# Patient Record
Sex: Female | Born: 1980
Health system: Southern US, Community
[De-identification: ages and names within clinical notes are randomized; demographics above are authoritative.]

## PROBLEM LIST (undated history)

## (undated) DIAGNOSIS — F429 Obsessive-compulsive disorder, unspecified: Secondary | ICD-10-CM

## (undated) DIAGNOSIS — E039 Hypothyroidism, unspecified: Secondary | ICD-10-CM

## (undated) HISTORY — DX: Obsessive-compulsive disorder, unspecified: F42.9

## (undated) HISTORY — DX: Hypothyroidism, unspecified: E03.9

## (undated) HISTORY — PX: WISDOM TOOTH EXTRACTION: SHX21

---

## 2015-12-13 DIAGNOSIS — D539 Nutritional anemia, unspecified: Secondary | ICD-10-CM | POA: Diagnosis not present

## 2015-12-13 DIAGNOSIS — R7989 Other specified abnormal findings of blood chemistry: Secondary | ICD-10-CM | POA: Diagnosis not present

## 2015-12-13 DIAGNOSIS — E559 Vitamin D deficiency, unspecified: Secondary | ICD-10-CM | POA: Diagnosis not present

## 2016-03-10 DIAGNOSIS — E559 Vitamin D deficiency, unspecified: Secondary | ICD-10-CM | POA: Diagnosis not present

## 2016-03-10 DIAGNOSIS — E039 Hypothyroidism, unspecified: Secondary | ICD-10-CM | POA: Diagnosis not present

## 2016-03-10 DIAGNOSIS — D539 Nutritional anemia, unspecified: Secondary | ICD-10-CM | POA: Diagnosis not present

## 2016-03-22 DIAGNOSIS — Z131 Encounter for screening for diabetes mellitus: Secondary | ICD-10-CM | POA: Diagnosis not present

## 2016-05-12 DIAGNOSIS — H5213 Myopia, bilateral: Secondary | ICD-10-CM | POA: Diagnosis not present

## 2016-05-12 DIAGNOSIS — H52223 Regular astigmatism, bilateral: Secondary | ICD-10-CM | POA: Diagnosis not present

## 2016-07-07 DIAGNOSIS — R7989 Other specified abnormal findings of blood chemistry: Secondary | ICD-10-CM | POA: Diagnosis not present

## 2016-07-07 DIAGNOSIS — Z131 Encounter for screening for diabetes mellitus: Secondary | ICD-10-CM | POA: Diagnosis not present

## 2016-07-07 DIAGNOSIS — E559 Vitamin D deficiency, unspecified: Secondary | ICD-10-CM | POA: Diagnosis not present

## 2016-07-07 DIAGNOSIS — E039 Hypothyroidism, unspecified: Secondary | ICD-10-CM | POA: Diagnosis not present

## 2016-07-31 DIAGNOSIS — L578 Other skin changes due to chronic exposure to nonionizing radiation: Secondary | ICD-10-CM | POA: Diagnosis not present

## 2016-07-31 DIAGNOSIS — D485 Neoplasm of uncertain behavior of skin: Secondary | ICD-10-CM | POA: Diagnosis not present

## 2016-07-31 DIAGNOSIS — D225 Melanocytic nevi of trunk: Secondary | ICD-10-CM | POA: Diagnosis not present

## 2017-05-18 DIAGNOSIS — H5213 Myopia, bilateral: Secondary | ICD-10-CM | POA: Diagnosis not present

## 2017-05-18 DIAGNOSIS — H52223 Regular astigmatism, bilateral: Secondary | ICD-10-CM | POA: Diagnosis not present

## 2017-08-15 DIAGNOSIS — E039 Hypothyroidism, unspecified: Secondary | ICD-10-CM | POA: Diagnosis not present

## 2017-08-15 DIAGNOSIS — Z79899 Other long term (current) drug therapy: Secondary | ICD-10-CM | POA: Diagnosis not present

## 2017-08-15 DIAGNOSIS — F429 Obsessive-compulsive disorder, unspecified: Secondary | ICD-10-CM | POA: Diagnosis not present

## 2017-08-15 DIAGNOSIS — E611 Iron deficiency: Secondary | ICD-10-CM | POA: Diagnosis not present

## 2017-08-15 DIAGNOSIS — E559 Vitamin D deficiency, unspecified: Secondary | ICD-10-CM | POA: Diagnosis not present

## 2017-08-15 DIAGNOSIS — Z13220 Encounter for screening for lipoid disorders: Secondary | ICD-10-CM | POA: Diagnosis not present

## 2017-08-15 DIAGNOSIS — Z131 Encounter for screening for diabetes mellitus: Secondary | ICD-10-CM | POA: Diagnosis not present

## 2017-11-23 DIAGNOSIS — M9905 Segmental and somatic dysfunction of pelvic region: Secondary | ICD-10-CM | POA: Diagnosis not present

## 2017-11-23 DIAGNOSIS — M545 Low back pain: Secondary | ICD-10-CM | POA: Diagnosis not present

## 2017-11-23 DIAGNOSIS — M9902 Segmental and somatic dysfunction of thoracic region: Secondary | ICD-10-CM | POA: Diagnosis not present

## 2017-11-23 DIAGNOSIS — M9903 Segmental and somatic dysfunction of lumbar region: Secondary | ICD-10-CM | POA: Diagnosis not present

## 2018-02-13 ENCOUNTER — Other Ambulatory Visit: Payer: Self-pay | Admitting: Family

## 2018-02-13 DIAGNOSIS — N63 Unspecified lump in unspecified breast: Secondary | ICD-10-CM

## 2018-02-13 DIAGNOSIS — Z0000 Encounter for general adult medical examination without abnormal findings: Secondary | ICD-10-CM | POA: Diagnosis not present

## 2018-02-13 DIAGNOSIS — Z1331 Encounter for screening for depression: Secondary | ICD-10-CM | POA: Diagnosis not present

## 2018-02-13 DIAGNOSIS — E559 Vitamin D deficiency, unspecified: Secondary | ICD-10-CM | POA: Diagnosis not present

## 2018-02-13 DIAGNOSIS — Z124 Encounter for screening for malignant neoplasm of cervix: Secondary | ICD-10-CM | POA: Diagnosis not present

## 2018-02-15 ENCOUNTER — Other Ambulatory Visit: Payer: Self-pay | Admitting: Family

## 2018-02-15 ENCOUNTER — Ambulatory Visit
Admission: RE | Admit: 2018-02-15 | Discharge: 2018-02-15 | Disposition: A | Discharge status: Home / Self Care | Payer: 59 | Source: Ambulatory Visit | Attending: Family | Admitting: Family

## 2018-02-15 DIAGNOSIS — N63 Unspecified lump in unspecified breast: Secondary | ICD-10-CM

## 2018-02-15 DIAGNOSIS — N6489 Other specified disorders of breast: Secondary | ICD-10-CM | POA: Diagnosis not present

## 2018-02-15 DIAGNOSIS — R922 Inconclusive mammogram: Secondary | ICD-10-CM | POA: Diagnosis not present

## 2018-08-09 DIAGNOSIS — H5213 Myopia, bilateral: Secondary | ICD-10-CM | POA: Diagnosis not present

## 2018-08-09 DIAGNOSIS — H52223 Regular astigmatism, bilateral: Secondary | ICD-10-CM | POA: Diagnosis not present

## 2018-09-05 DIAGNOSIS — Z862 Personal history of diseases of the blood and blood-forming organs and certain disorders involving the immune mechanism: Secondary | ICD-10-CM | POA: Diagnosis not present

## 2018-09-05 DIAGNOSIS — E039 Hypothyroidism, unspecified: Secondary | ICD-10-CM | POA: Diagnosis not present

## 2018-09-05 DIAGNOSIS — E559 Vitamin D deficiency, unspecified: Secondary | ICD-10-CM | POA: Diagnosis not present

## 2018-09-05 DIAGNOSIS — F429 Obsessive-compulsive disorder, unspecified: Secondary | ICD-10-CM | POA: Diagnosis not present

## 2018-09-05 DIAGNOSIS — Z131 Encounter for screening for diabetes mellitus: Secondary | ICD-10-CM | POA: Diagnosis not present

## 2018-09-05 DIAGNOSIS — Z79899 Other long term (current) drug therapy: Secondary | ICD-10-CM | POA: Diagnosis not present

## 2018-09-05 DIAGNOSIS — Z6824 Body mass index (BMI) 24.0-24.9, adult: Secondary | ICD-10-CM | POA: Diagnosis not present

## 2018-12-11 DIAGNOSIS — E559 Vitamin D deficiency, unspecified: Secondary | ICD-10-CM | POA: Diagnosis not present

## 2019-03-12 DIAGNOSIS — Z6823 Body mass index (BMI) 23.0-23.9, adult: Secondary | ICD-10-CM | POA: Diagnosis not present

## 2019-03-12 DIAGNOSIS — Z131 Encounter for screening for diabetes mellitus: Secondary | ICD-10-CM | POA: Diagnosis not present

## 2019-03-12 DIAGNOSIS — E039 Hypothyroidism, unspecified: Secondary | ICD-10-CM | POA: Diagnosis not present

## 2019-03-12 DIAGNOSIS — F429 Obsessive-compulsive disorder, unspecified: Secondary | ICD-10-CM | POA: Diagnosis not present

## 2019-03-12 DIAGNOSIS — R5383 Other fatigue: Secondary | ICD-10-CM | POA: Diagnosis not present

## 2019-03-12 DIAGNOSIS — Z862 Personal history of diseases of the blood and blood-forming organs and certain disorders involving the immune mechanism: Secondary | ICD-10-CM | POA: Diagnosis not present

## 2019-03-12 DIAGNOSIS — E559 Vitamin D deficiency, unspecified: Secondary | ICD-10-CM | POA: Diagnosis not present

## 2019-09-05 DIAGNOSIS — H52223 Regular astigmatism, bilateral: Secondary | ICD-10-CM | POA: Diagnosis not present

## 2019-09-05 DIAGNOSIS — H5213 Myopia, bilateral: Secondary | ICD-10-CM | POA: Diagnosis not present

## 2019-10-27 DIAGNOSIS — Z1231 Encounter for screening mammogram for malignant neoplasm of breast: Secondary | ICD-10-CM | POA: Diagnosis not present

## 2019-10-27 DIAGNOSIS — E559 Vitamin D deficiency, unspecified: Secondary | ICD-10-CM | POA: Diagnosis not present

## 2019-10-27 DIAGNOSIS — Z862 Personal history of diseases of the blood and blood-forming organs and certain disorders involving the immune mechanism: Secondary | ICD-10-CM | POA: Diagnosis not present

## 2019-10-27 DIAGNOSIS — Z131 Encounter for screening for diabetes mellitus: Secondary | ICD-10-CM | POA: Diagnosis not present

## 2019-10-27 DIAGNOSIS — Z13220 Encounter for screening for lipoid disorders: Secondary | ICD-10-CM | POA: Diagnosis not present

## 2019-10-27 DIAGNOSIS — Z1331 Encounter for screening for depression: Secondary | ICD-10-CM | POA: Diagnosis not present

## 2019-10-27 DIAGNOSIS — E039 Hypothyroidism, unspecified: Secondary | ICD-10-CM | POA: Diagnosis not present

## 2019-10-27 DIAGNOSIS — Z6824 Body mass index (BMI) 24.0-24.9, adult: Secondary | ICD-10-CM | POA: Diagnosis not present

## 2019-10-27 DIAGNOSIS — Z0000 Encounter for general adult medical examination without abnormal findings: Secondary | ICD-10-CM | POA: Diagnosis not present

## 2020-03-16 ENCOUNTER — Other Ambulatory Visit (HOSPITAL_COMMUNITY): Payer: Self-pay | Admitting: Family

## 2020-05-24 ENCOUNTER — Ambulatory Visit (INDEPENDENT_AMBULATORY_CARE_PROVIDER_SITE_OTHER): Payer: 59

## 2020-05-24 ENCOUNTER — Other Ambulatory Visit: Payer: Self-pay

## 2020-05-24 DIAGNOSIS — Z23 Encounter for immunization: Secondary | ICD-10-CM

## 2020-05-24 NOTE — Progress Notes (Signed)
   Covid-19 Vaccination Clinic  Name:  Sarah Estrada    MRN: [id number] DOB: 26-Apr-1981  05/24/2020  Ms. Sarah Estrada was observed post Covid-19 immunization for 15 minutes without incident. She was provided with Vaccine Information Sheet and instruction to access the V-Safe system.   Ms. Sarah Estrada was instructed to call 911 with any severe reactions post vaccine: Marland Kitchen Difficulty breathing  . Swelling of face and throat  . A fast heartbeat  . A bad rash all over body  . Dizziness and weakness

## 2020-06-10 DIAGNOSIS — Z131 Encounter for screening for diabetes mellitus: Secondary | ICD-10-CM | POA: Diagnosis not present

## 2020-06-10 DIAGNOSIS — Z79899 Other long term (current) drug therapy: Secondary | ICD-10-CM | POA: Diagnosis not present

## 2020-06-10 DIAGNOSIS — E039 Hypothyroidism, unspecified: Secondary | ICD-10-CM | POA: Diagnosis not present

## 2020-06-10 DIAGNOSIS — Z13220 Encounter for screening for lipoid disorders: Secondary | ICD-10-CM | POA: Diagnosis not present

## 2020-06-10 DIAGNOSIS — E559 Vitamin D deficiency, unspecified: Secondary | ICD-10-CM | POA: Diagnosis not present

## 2020-06-10 DIAGNOSIS — F429 Obsessive-compulsive disorder, unspecified: Secondary | ICD-10-CM | POA: Diagnosis not present

## 2020-08-19 DIAGNOSIS — N926 Irregular menstruation, unspecified: Secondary | ICD-10-CM | POA: Diagnosis not present

## 2020-08-28 NOTE — L&D Delivery Note (Addendum)
OB/GYN Faculty Practice Delivery Note  Sarah Estrada is a 40 y.o. G1P0 s/p SVD at [redacted]w[redacted]d She was admitted for IOL for fetal cardiac anomalies.   ROM: 7h 34mith clear fluid GBS Status: Negative Maximum Maternal Temperature: 98.1  Labor Progress: Admitted for induction with 2 cm dilated cervix, given cytotec x 2, foley bulb, pitocin  NICU called to be in attendance of birth d/t suspected fetal cardiac abnormalities Delivery Date/Time: 04/06/2021 @ 0734 Delivery: Called to room and patient was complete and pushing. Patient with tight introitus and despite excellent maternal effort, head was not progressing and fetal heart rate dropped to the 60s without good recovery so a right midline episiotomy was made. Head delivered Left OA. No nuchal cord present. Shoulder and body delivered in usual fashion. Infant with spontaneous cry, placed on mother's abdomen, dried and stimulated. Cord clamped x 2 after 1-minute delay, and cut by MD (parents declined). Cord blood drawn. Pitocin infusing per protocol. Placenta delivered spontaneously with gentle cord traction.  Labia, perineum, vagina, and cervix inspected inspected with Rt MLE, Rt sulcal. Heavy bleeding noted immediately after delivery of placenta, TXA given. (All above by Sarah MerlMD under my direct supervision). At this point, I stepped in, fundus firm w/ massage, but still bleeding. '1000mg'$  cytotec ('600mg'$  rectal/'400mg'$  buccal) as I repaired lac/episiotomy, continued to have heavy bleeding, so methergine given and Dr. ArRoselie Awkwardalled in. I inserted a indwelling foley. LUS cleared of clots, small fragments of placenta/membranes removed, fundus firms w/ massage, but then bleeding returns. Pt beginning to feel symptomatic, hypotensive/normal HR, O2 via face mask administered, 2nd IV started, PPH labs obtained. Bakri balloon placed by Sarah Estrada difficulty and 2nd dose of methergine given. Bleeding slowed and stabilized. [facility] 1uFFP  per Dr. ArRoselie AwkwardLabs pending Placenta: to L&D Complications: Postpartum hemorrhage Lacerations: Right midline episiotomy/2nd degree laceration and right sulcal- repaired w/ 3.0vicryl EBL: 2300cc Analgesia: Epidural  Baby assessed by Sarah Estrada doing well, ok to remain in room w/ parents, plan echocardiogram during hospitalization.  Infant: Viable female infant  APGARs pending  pending   Sarah Estrada 04/06/2021, 7:44 AM  Sarah Estrada, WHNP-BC 04/06/2021 9:20 AM

## 2020-09-08 ENCOUNTER — Telehealth: Payer: Self-pay | Admitting: Family Medicine

## 2020-09-08 NOTE — Telephone Encounter (Signed)
Attempted to reach patient about scheduling an appointment.

## 2020-10-15 ENCOUNTER — Other Ambulatory Visit: Payer: Self-pay

## 2020-10-15 ENCOUNTER — Other Ambulatory Visit (HOSPITAL_COMMUNITY)
Admission: RE | Admit: 2020-10-15 | Discharge: 2020-10-15 | Disposition: A | Discharge status: Home / Self Care | Payer: 59 | Source: Ambulatory Visit | Attending: Obstetrics & Gynecology | Admitting: Obstetrics & Gynecology

## 2020-10-15 ENCOUNTER — Encounter: Payer: Self-pay | Admitting: Obstetrics & Gynecology

## 2020-10-15 ENCOUNTER — Other Ambulatory Visit (HOSPITAL_COMMUNITY): Payer: Self-pay | Admitting: Obstetrics & Gynecology

## 2020-10-15 ENCOUNTER — Ambulatory Visit (INDEPENDENT_AMBULATORY_CARE_PROVIDER_SITE_OTHER): Payer: 59 | Admitting: Obstetrics & Gynecology

## 2020-10-15 VITALS — BP 101/60 | HR 73 | Ht 66.0 in | Wt 163.2 lb

## 2020-10-15 DIAGNOSIS — O99280 Endocrine, nutritional and metabolic diseases complicating pregnancy, unspecified trimester: Secondary | ICD-10-CM | POA: Insufficient documentation

## 2020-10-15 DIAGNOSIS — E039 Hypothyroidism, unspecified: Secondary | ICD-10-CM | POA: Insufficient documentation

## 2020-10-15 DIAGNOSIS — F429 Obsessive-compulsive disorder, unspecified: Secondary | ICD-10-CM

## 2020-10-15 DIAGNOSIS — O0992 Supervision of high risk pregnancy, unspecified, second trimester: Secondary | ICD-10-CM | POA: Insufficient documentation

## 2020-10-15 DIAGNOSIS — O0990 Supervision of high risk pregnancy, unspecified, unspecified trimester: Secondary | ICD-10-CM | POA: Insufficient documentation

## 2020-10-15 DIAGNOSIS — O99281 Endocrine, nutritional and metabolic diseases complicating pregnancy, first trimester: Secondary | ICD-10-CM | POA: Diagnosis not present

## 2020-10-15 DIAGNOSIS — Z3A13 13 weeks gestation of pregnancy: Secondary | ICD-10-CM

## 2020-10-15 DIAGNOSIS — O09519 Supervision of elderly primigravida, unspecified trimester: Secondary | ICD-10-CM | POA: Insufficient documentation

## 2020-10-15 HISTORY — DX: Supervision of elderly primigravida, unspecified trimester: O09.519

## 2020-10-15 MED ORDER — ASPIRIN EC 81 MG PO TBEC: 81.0000 mg | DELAYED_RELEASE_TABLET | Freq: Every day | ORAL | 2 refills | Status: DC

## 2020-10-15 NOTE — Progress Notes (Signed)
History:   Sarah Estrada is a 40 y.o. G1P0 at [redacted]w[redacted]d by LMP being seen today for her first obstetrical visit.  Her obstetrical history is significant for advanced maternal age and hypothyroidism. Patient does intend to breast feed. Pregnancy history fully reviewed.  Patient reports no complaints. Here with husband. 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      HISTORY: OB History  Gravida Para Term Preterm AB Living  1 0 0 0 0 0  SAB IAB Ectopic Multiple Live Births  0 0 0 0 0    # Outcome Date GA Lbr Len/2nd Weight Sex Delivery Anes PTL Lv  1 Current           Last pap smear was done 2019 and was normal  Past Medical History:  Diagnosis Date  . Hypothyroidism   . OCD (obsessive compulsive disorder)    Past Surgical History:  Procedure Laterality Date  . WISDOM TOOTH EXTRACTION     Family History  Problem Relation Age of Onset  . Breast cancer Neg Hx    Social History   Tobacco Use  . Smoking status: Never Smoker  . Smokeless tobacco: Never Used  Substance Use Topics  . Alcohol use: Not Currently  . Drug use: Never   Not on File Current Outpatient Medications on File Prior to Visit  Medication Sig Dispense Refill  . buPROPion (ZYBAN) 150 MG 12 hr tablet Take 150 mg by mouth 2 (two) times daily.    Marland Kitchen escitalopram (LEXAPRO) 20 MG tablet Take 20 mg by mouth daily.    Marland Kitchen levothyroxine (SYNTHROID) 75 MCG tablet Take 75 mcg by mouth daily before breakfast.    . omega-3 acid ethyl esters (LOVAZA) 1 g capsule Take by mouth 2 (two) times daily.    . Prenatal Vit-Fe Fumarate-FA (PRENATAL MULTIVITAMIN) TABS tablet Take 1 tablet by mouth daily at 12 noon.     No current facility-administered medications on file prior to visit.    Review of Systems Pertinent items noted in HPI and remainder of comprehensive ROS otherwise negative.  Physical Exam:   Vitals:   10/15/20 1031 10/15/20 1037  BP: 101/60   Pulse: 73   Weight: 163 lb 3.2 oz (74 kg)   Height:   5\' 6"  (1.676 m)   Bedside Ultrasound for FHR check: Viable intrauterine pregnancy with positive cardiac activity noted. Fetal Heart Rate (bpm): 157  Patient informed that the ultrasound is considered a limited obstetric ultrasound and is not intended to be a complete ultrasound exam.  Patient also informed that the ultrasound is not being completed with the intent of assessing for fetal or placental anomalies or any pelvic abnormalities.  Explained that the purpose of today's ultrasound is to assess for fetal heart rate.  Patient acknowledges the purpose of the exam and the limitations of the study. General: well-developed, well-nourished female in no acute distress  Breasts:  normal appearance, no masses or tenderness bilaterally  Skin: normal coloration and turgor, no rashes  Neurologic: oriented, normal, negative, normal mood  Extremities: normal strength, tone, and muscle mass, ROM of all joints is normal  HEENT PERRLA, extraocular movement intact and sclera clear, anicteric  Neck supple and no masses  Cardiovascular: regular rate and rhythm  Respiratory:  no respiratory distress, normal breath sounds  Abdomen: soft, non-tender; bowel sounds normal; no masses,  no organomegaly  Pelvic: normal external genitalia, no lesions, normal vaginal mucosa, normal vaginal discharge, normal cervix, pap smear  done.    Assessment:    Pregnancy: G1P0 Patient Active Problem List   Diagnosis Date Noted  . Supervision of high-risk pregnancy 10/15/2020  . Hypothyroidism affecting pregnancy in first trimester 10/15/2020  . Obsessive-compulsive disorder 10/15/2020     Plan:    1. Obsessive-compulsive disorder, unspecified type On Lexapro and Zyban, discussed risks of PPHN especially in third trimester.  Will discuss with mental health provider about alternatives.   2. Hypothyroidism affecting pregnancy in first trimester Followed by PCP. Continue Synthroid.  - TSH  3. [redacted] weeks gestation of  pregnancy 4. Supervision of high risk pregnancy in second trimester - Cytology - PAP( [place]) - CBC/D/Plt+RPR+Rh+ABO+Rub Ab... - CHL AMB BABYSCRIPTS SCHEDULE OPTIMIZATION - Culture, OB Urine - Genetic Screening - Hemoglobin A1c - buPROPion (ZYBAN) 150 MG 12 hr tablet; Take 150 mg by mouth 2 (two) times daily. - levothyroxine (SYNTHROID) 75 MCG tablet; Take 75 mcg by mouth daily before breakfast. - escitalopram (LEXAPRO) 20 MG tablet; Take 20 mg by mouth daily. - omega-3 acid ethyl esters (LOVAZA) 1 g capsule; Take by mouth 2 (two) times daily. - Prenatal Vit-Fe Fumarate-FA (PRENATAL MULTIVITAMIN) TABS tablet; Take 1 tablet by mouth daily at 12 noon. - [place] MFM OB DETAIL +14 WK; Future - Comprehensive metabolic panel - Protein / creatinine ratio, urine - aspirin EC 81 MG tablet; Take 1 tablet (81 mg total) by mouth daily. Take after 12 weeks for prevention of preeclampsia later in pregnancy  Dispense: 300 tablet; Refill: 2 - Enroll patient in Babyscripts Program Initial labs drawn. Continue prenatal vitamins. Problem list reviewed and updated. Genetic Screening discussed, NIPS: ordered. Ultrasound discussed; fetal anatomic survey: ordered. Anticipatory guidance about prenatal visits given including labs, ultrasounds, and testing. Discussed usage of Babyscripts and virtual visits as additional source of managing and completing prenatal visits in midst of coronavirus and pandemic.   Encouraged to complete MyChart Registration for her ability to review results, send requests, and have questions addressed.  The nature of [place] [facility] Healthcare/Faculty Practice with multiple MDs and Advanced Practice Providers was explained to patient; also emphasized that residents, students are part of our team. Routine obstetric precautions reviewed. Encouraged to seek out care at office or emergency room [facility] preferred) for urgent and/or emergent concerns. Return in about 4 weeks  (around 11/12/2020) for OFFICE OB VISIT (MD only).     [name], MD, [place] for [employer], [place]

## 2020-10-15 NOTE — Patient Instructions (Signed)
AREA PEDIATRIC/FAMILY PRACTICE PHYSICIANS  [place] ([address]) . [facility] o Chambliss, MD; Eniola, MD; Hale, MD; Hensel, MD; McDiarmid, MD; McIntyer, MD; Eriko Economos, MD; Walden, MD o [address] [place], [place] [address] o [phone] o Mon-Fri 8:30-12:30, 1:30-5:00 o Providers come to see babies at [facility] o Accepting Medicaid . [facility] o Limited providers who accept newborns: Koirala, MD; Morrow, MD; Wolters, MD o [address], [place], [place] [address] o [phone] o Mon-Fri 8:00-5:30 o Babies seen by providers at [facility] o Does NOT accept Medicaid o Please call early in hospitalization for appointment (limited availability)  . [facility] o Mulberry, MD o [address] [place], [place] [address] o [phone] o Mon, Tue, Thur, Fri 8:30-5:00, Wed 10:00-7:00 (closed 1-2pm) o Babies seen by [facility] providers o Accepting Medicaid . [facility] o Rubin, MD o [address], [place], [place] [address] o [phone] o Mon-Fri 8:30-5:00, Sat 8:30-12:00 o Provider comes to see babies at [facility] o Accepting Medicaid o Must have been referred from current patients or contacted office prior to delivery . [facility] ([facility]) o Brown, MD; Chandler, MD; Ettefagh, MD; Grant, MD; Lester, MD; McCormick, MD; McQueen, MD; Prose, MD; Simha, MD; Stanley, MD; Stryffeler, NP; Tebben, NP o [address], [place], [place] [address] o [phone] o Mon, Tue, Thur, Fri 8:30-5:30, Wed 9:30-5:30, Sat 8:30-12:30 o Babies seen by [facility] providers o Accepting Medicaid o Only accepting infants of first-time parents or siblings of current patients o Hospital discharge coordinator will make follow-up appointment . Sarah Estrada o [address],  [place], [place]  [address] o [phone]   Fax - [phone] . [facility] o [address], [place], [place]  [address] o Phone - [phone]   Fax - [phone] . Shilpa Gosrani o [address], [place], [place]  [address] o [phone]  [place] ([address]) . [facility] o Bates, MD; [place], MD; Cooper, Cox, MD; MD; Davis, MD; Dovico, MD; Ettefaugh, MD; Little, MD; Lowe, MD; Keiffer, MD; Melvin, MD; Sumner, MD; Williams, MD o [address], [place], [place] [address] o [phone] o Mon-Fri 8:30-5:00 (extended evenings Mon-Thur as needed), Sat-Sun 10:00-1:00 o Providers come to see babies at [facility] o Accepting Medicaid for families of first-time babies and families with all children in the household age 3 and under. Must register with office prior to making appointment (M-F only). . [facility] o Henson, NP; Knapp, MD; Lalonde, MD; Tysinger, PA o [address] [place], [place] [address] o [phone] o Mon-Fri 8:00-5:00 o Babies seen by providers at [facility] o Does NOT accept Medicaid/Commercial Insurance Only . [facility] ([facility])  o Artis, MD; Barnes, MD; Bratton, MD; Coccaro, MD; Lockett Gardner, MD; Kramer, MD; Marshall, MD; Netherton, MD; Poleto, MD; Skinner, MD o [address] [place], [place] [address] o [phone] o Mon-Fri 8:30-5:30, Sat (Oct.-Mar.) 9:00-1:00 o Babies seen by providers at [facility] o Accepting Medicaid  [place] ([address]) . [facility]  o Reid, MD; Warner, MD o [address], , [place] [address] o [phone] o Mon-Fri 8:30-5:00, Sat 8:30-12:00 o Providers come to see babies at [facility] o Does NOT accept Medicaid . [facility] o Becker, PA; Hagler, MD; Scifres, PA; Sun, MD; Swayne, MD o [address],  [place], [place] [address] o [phone] o Mon-Fri 8:00-5:00 o Babies seen by providers at [facility] o Does NOT accept Medicaid o Only accepting babies of parents who are patients o Please call early in hospitalization for appointment (limited availability) . [facility] o Clark, MD; Frye, MD; Kelleher, MD; Mack, NP; Miller, MD; O'Keller, MD; Patterson, NP; Pudlo, MD; Puzio, MD; Thomas, MD; Tucker, MD; Twiselton, MD o [address], [place], [place] [address] o [phone] o Mon-Fri 8:00-5:00, Sat 9:00-12:00 o Providers come to see babies at [facility] o Does NOT accept Medicaid  [place] ([address]) . [facility] o Limited providers accepting new patients: Brake, NP; Wharton, PA o [address], [place], [place] [address] o [phone] o Mon-Fri 8:00-5:00 o Babies seen by providers at [facility] o Does NOT accept Medicaid o Only accepting babies of parents who are patients o Please call early in hospitalization for appointment (limited availability) . [facility] o Gay, MD; Quinlan, MD o [address] [place], [place] [address] o [phone] (press 1 to schedule appointment) o Mon-Fri 8:00-5:00 o Providers come to see babies at [facility] o Does NOT accept Medicaid . [facility] o Mazer, MD o [address] [place], [place] [address] o [phone] o Mon-Fri 8:30-5:00 (lunch 12:30-1:00), extended hours by appointment only Wed 5:00-6:30 o Babies seen by [facility] providers o Accepting Medicaid . [facility] o Banks, MD; Jordan, MD; Koberlein, MD o [address], [place], [place] [address] o [phone] o Mon-Fri 8:00-5:00 o Babies seen by [facility] providers o Does NOT accept Medicaid .  [facility] o Parker, MD; Hunter, MD; Wallace, DO o [address] [place], [place]  [address] o [phone] o Mon-Fri 8:00-5:00 o Babies seen by [facility] providers o Does NOT accept Medicaid . [facility] o Brandon, PA; Brecken, PA; Christy, NP; Dees, MD; DeClaire, MD; DeWeese, MD; Hansen, NP; Mills, NP; Parrish, NP; Smoot, NP; Summer, MD; Vapne, MD o [address] [place], [place] [address] o [phone] o Mon-Fri 8:30-5:00, Sat 10:00-1:00 o Providers come to see babies at [facility] o Does NOT accept Medicaid o Free prenatal information session Tuesdays at 4:45pm . [facility] o Bouska, MD; Gordon, PA; Jeffery, PA; Weber, PA o [address] [place] [address] o [phone] o Mon-Fri 7:30-5:30 o Babies seen by [facility] providers . [facility] o [address], [place], [place]  [address] o [phone]   Fax - [phone]  [place] ([address] & [address]) . [facility] o Reese, MD o [address] [place], [place] [address] o [phone] o Mon-Thur 8:00-6:00 o Providers come to see babies at [facility] o Accepting Medicaid . [facility] o Anderson, NP; Badger, MD; Beal, PA; Spencer, PA o [address] [place],  [address] o [phone] o Mon-Thur 7:30-7:30, Fri 7:30-4:30 o Babies seen by [facility] providers o Accepting Medicaid . [facility] o Agbuya, MD; Klett, NP; Romgoolam, MD o [address], [place],  [address] o [phone] o Mon-Fri 8:30-5:00, Sat 8:30-12:00 o Providers come to see babies at [facility] o Accepting Medicaid o Must have "Meet & Greet" appointment at office prior to delivery . [facility] o McCord,   MD; Wallace, MD; Wood, MD o [address], [place], [place] [address] o [phone] o Mon-Wed 8:00-6:00, Thur-Fri 8:00-5:00, Sat 9:00-12:00 o Providers come to  see babies at [facility] o Does NOT accept Medicaid o Only accepting siblings of current patients . [facility]  o [address], [place], [place]  [address] o [phone]   Fax - [phone] . [facility] o [address], [place], [place]  [address] o [phone]   Fax - [phone]  [place] ([address] & [address]) . [facility] o Cirigliano, DO; Matthews, DO o [address] [place], [place] [address] o [phone] o Mon-Fri 7:00-5:00 o Babies seen by [facility] providers o Does NOT accept Medicaid . [facility] o Briscoe, MD; Howley, PA; Moreira, PA o [address], [place], [place] [address] o [phone] o Mon-Fri 8:00-5:00 o Babies seen by [facility] providers o Accepting Medicaid . [facility] o Boyd, MD; Church, PA; Jones, NP; Osborn, PA o [address], [place], [place] [address] o [phone] o Mon-Fri 8:00-5:00 o Babies seen by providers at [facility] o Accepting Medicaid  [place] ([address]) . [facility] [place] o Wendling, DO o [address] [place], [place] [address] o [phone] o Mon-Fri 8:00-5:00 o Babies seen by [facility] providers o Does NOT accept Medicaid o Limited availability, please call early in hospitalization to schedule follow-up . [facility] o Calderon, PA; Cummings, MD; Dillard, MD; Martin, PA; Olson, MD; VanDeven, PA o [address], [place], [place] [address] o [phone] o Mon-Fri 8:30-5:00, Sat 9:00-12:00 o Babies seen by providers at [facility] o Accepting Medicaid o Please register online then schedule online or call office o [email or web address] . [facility] o Hunter, NP; Kumar, MD; Martin Rogers, PA o [address], [place], [place] [address] o [phone] o Mon-Fri 8:00-5:00 o Babies seen by providers at [facility] o Accepting Medicaid . [facility] ([facility] o Ogle, MD; Kristi Fleenor, NP; West, MD o [address], [place], [place] [address] o [phone] o Mon-Fri 8:00-5:30, Sat&Sun by appointment (phones open at 8:30) o Babies seen by [facility] providers o Accepting Medicaid o Must be a first-time baby or sibling of current patient . [facility] [place]  o [address], [place], [place]  [address] o [phone]   Fax - [phone]  [place] ([address]) . [facility] o Brown, PA; Cowen, PA; Rice, MD; Helton, PA; Spry, MD o [address] [place], [place] [address] o [phone] o Mon-Thur 8:00-7:00, Fri 8:00-5:00, Sat 8:00-12:00, Sun 9:00-12:00 o Babies seen by [facility] providers o Accepting Medicaid . [facility] o Coe-Goins, MD; Marshall, MD; Pierre-Louis, MD o [address], [place], [place] [address] o [phone] o Mon-Thur 8:00-5:00 o Babies seen by providers at [facility] o Accepting Medicaid . [facility] o Bratton, MD; Coe-Goins, MD; Hayes, MD; Lewis, MD; List, MD; Lott, MD; Marshall, MD; Moran, MD; O'Raea Magallon, MD; Pierre-Louis, MD; Pitonzo, MD; Scholer, MD; Spangle, MD o [address] [place], [place]   [address] o [phone] o Mon-Fri 8:00-5:30, Sat (Oct.-Mar.) 9:00-1:00 o Babies seen by providers at [facility] o Accepting Medicaid o Must fill out new patient packet, available online at [email or web address] . [facility] ([facility] o Friddle, NP; Harris, NP; Kelly, NP; Logan, MD;  Melvin, PA; Poth, MD; Ramadoss, MD; [place], NP o [address], [place], [place] [address] o [phone] o Mon-Thur 8:00-5:30, Fri 8:00-5:00 o Babies seen by providers at [facility] o Accepting Medicaid  [place] ([address]) . [facility] o Dixon, PA; Wailuku, MD; Pickard, MD; Tapia, PA o [address], [place], [place] [address] o [phone] o Mon-Fri 8:00-5:00 o Babies seen by providers at [facility] o Accepting Medicaid   [place] ([address]) . [facility] [place] o Masneri, DO; Meyers, MD; Nelson, PA o [address], [place], [place] [address] o [phone] o Mon-Fri 8:00-5:00 o Babies seen by providers at [facility] o Does NOT accept Medicaid o Limited appointment availability, please call early in hospitalization  . [facility] [place] o Kunedd, DO; McGowen, MD o [address], [place], [place] [address] o [phone] o Mon-Fri 8:00-5:00 o Babies seen by [facility] providers o Does NOT accept Medicaid . [facility] [place] o Cameron, MD; MacDonald, MD; Michaels, PA; Nayak, MD o [address] [place] [address], [place], [place] [address] o [phone] o Mon-Fri 8:00-5:00 o After hours clinic ([address] [place], [place] [address]) [phone] Mon-Fri 5:00-8:00, Sat 12:00-6:00, Sun 10:00-4:00 o Babies seen by [facility] providers o Accepting Medicaid . [facility] [place] o [address], [place], [place]  [address] o [phone]   Fax - [phone]  [place] ([address]) . [facility] [place] [facility] o Andy, MD o [address], [place], [place] [address] o [phone] o Mon-Fri 8:00-5:00 o Babies seen by [facility] providers o Does NOT accept Medicaid . [facility] [place] ([facility] [place]) o Eksir, MD o [address], [place], [place]  [address] o [phone] o Mon-Thur 8:00-7:00, Fri 8:00-5:00, Sat 8:00-12:00 o Babies seen by providers at [facility] o Accepting Medicaid - but does not have vaccinations in office (must be received elsewhere) o Limited availability, please call early in hospitalization  [place] ([address]) . [facility]  o Charlene Flemming, MD o [address], [place] [address] o [phone]  Fax [phone]   

## 2020-10-16 LAB — CBC/D/PLT+RPR+RH+ABO+RUB AB...
Antibody Screen: NEGATIVE
Basophils Absolute: 0 10*3/uL (ref 0.0–0.2)
Basos: 1 %
EOS (ABSOLUTE): 0 10*3/uL (ref 0.0–0.4)
Eos: 0 %
HCV Ab: <0.1 s/co ratio (ref 0.0–0.9)
HIV Screen 4th Generation wRfx: NONREACTIVE
Hematocrit: 35 % (ref 34.0–46.6)
Hemoglobin: 11.8 g/dL (ref 11.1–15.9)
Hepatitis B Surface Ag: NEGATIVE
Immature Grans (Abs): 0.1 10*3/uL (ref 0.0–0.1)
Immature Granulocytes: 1 %
Lymphocytes Absolute: 1.4 10*3/uL (ref 0.7–3.1)
Lymphs: 17 %
MCH: 28.6 pg (ref 26.6–33.0)
MCHC: 33.7 g/dL (ref 31.5–35.7)
MCV: 85 fL (ref 79–97)
Monocytes Absolute: 0.8 10*3/uL (ref 0.1–0.9)
Monocytes: 10 %
Neutrophils Absolute: 5.7 10*3/uL (ref 1.4–7.0)
Neutrophils: 71 %
Platelets: 238 10*3/uL (ref 150–450)
RBC: 4.13 x10E6/uL (ref 3.77–5.28)
RDW: 13.8 % (ref 11.7–15.4)
RPR Ser Ql: NONREACTIVE
Rh Factor: POSITIVE
Rubella Antibodies, IGG: 9.67 index (ref >0.99)
WBC: 7.9 10*3/uL (ref 3.4–10.8)

## 2020-10-16 LAB — PROTEIN / CREATININE RATIO, URINE
Creatinine, Urine: 50 mg/dL
Protein, Ur: 5 mg/dL
Protein/Creat Ratio: 100 mg/g creat (ref 0–200)

## 2020-10-16 LAB — COMPREHENSIVE METABOLIC PANEL
ALT: 42 IU/L — ABNORMAL HIGH (ref 0–32)
AST: 22 IU/L (ref 0–40)
Albumin/Globulin Ratio: 1.7 (ref 1.2–2.2)
Albumin: 4 g/dL (ref 3.8–4.8)
Alkaline Phosphatase: 54 IU/L (ref 44–121)
BUN/Creatinine Ratio: 22 (ref 9–23)
BUN: 12 mg/dL (ref 6–20)
Bilirubin Total: 0.3 mg/dL (ref 0.0–1.2)
CO2: 19 mmol/L — ABNORMAL LOW (ref 20–29)
Calcium: 9.6 mg/dL (ref 8.7–10.2)
Chloride: 101 mmol/L (ref 96–106)
Creatinine, Ser: 0.55 mg/dL — ABNORMAL LOW (ref 0.57–1.00)
GFR calc Af Amer: 139 mL/min/{1.73_m2} (ref >59)
GFR calc non Af Amer: 120 mL/min/{1.73_m2} (ref >59)
Globulin, Total: 2.3 g/dL (ref 1.5–4.5)
Glucose: 86 mg/dL (ref 65–99)
Potassium: 4.2 mmol/L (ref 3.5–5.2)
Sodium: 135 mmol/L (ref 134–144)
Total Protein: 6.3 g/dL (ref 6.0–8.5)

## 2020-10-16 LAB — HEMOGLOBIN A1C
Est. average glucose Bld gHb Est-mCnc: 105 mg/dL
Hgb A1c MFr Bld: 5.3 % (ref 4.8–5.6)

## 2020-10-16 LAB — TSH: TSH: 2.3 u[IU]/mL (ref 0.450–4.500)

## 2020-10-16 LAB — HCV INTERPRETATION

## 2020-10-18 LAB — CULTURE, OB URINE

## 2020-10-18 LAB — URINE CULTURE, OB REFLEX

## 2020-10-19 LAB — CYTOLOGY - PAP
Chlamydia: NEGATIVE
Comment: NEGATIVE
Comment: NEGATIVE
Comment: NEGATIVE
Comment: NORMAL
Diagnosis: NEGATIVE
High risk HPV: NEGATIVE
Neisseria Gonorrhea: NEGATIVE
Trichomonas: NEGATIVE

## 2020-10-26 ENCOUNTER — Encounter: Payer: Self-pay | Admitting: *Deleted

## 2020-10-26 ENCOUNTER — Encounter: Payer: Self-pay | Admitting: Obstetrics & Gynecology

## 2020-11-09 ENCOUNTER — Other Ambulatory Visit (HOSPITAL_COMMUNITY): Payer: Self-pay | Admitting: Family

## 2020-11-10 ENCOUNTER — Other Ambulatory Visit: Payer: Self-pay

## 2020-11-10 ENCOUNTER — Ambulatory Visit (INDEPENDENT_AMBULATORY_CARE_PROVIDER_SITE_OTHER): Payer: 59 | Admitting: Obstetrics & Gynecology

## 2020-11-10 VITALS — BP 120/65 | HR 82 | Wt 168.1 lb

## 2020-11-10 DIAGNOSIS — R7401 Elevation of levels of liver transaminase levels: Secondary | ICD-10-CM | POA: Diagnosis not present

## 2020-11-10 DIAGNOSIS — F429 Obsessive-compulsive disorder, unspecified: Secondary | ICD-10-CM

## 2020-11-10 DIAGNOSIS — O09519 Supervision of elderly primigravida, unspecified trimester: Secondary | ICD-10-CM | POA: Diagnosis not present

## 2020-11-10 DIAGNOSIS — O99282 Endocrine, nutritional and metabolic diseases complicating pregnancy, second trimester: Secondary | ICD-10-CM

## 2020-11-10 DIAGNOSIS — E039 Hypothyroidism, unspecified: Secondary | ICD-10-CM

## 2020-11-10 DIAGNOSIS — Z3A17 17 weeks gestation of pregnancy: Secondary | ICD-10-CM

## 2020-11-10 NOTE — Patient Instructions (Signed)
Second Trimester of Pregnancy  The second trimester of pregnancy is from week 13 through week 27. This is months 4 through 6 of pregnancy. The second trimester is often a time when you feel your best. Your body has adjusted to being pregnant, and you begin to feel better physically. During the second trimester:  Morning sickness has lessened or stopped completely.  You may have more energy.  You may have an increase in appetite. The second trimester is also a time when the unborn baby (fetus) is growing rapidly. At the end of the sixth month, the fetus may be up to 12 inches long and weigh about 1 pounds. You will likely begin to feel the baby move (quickening) between 16 and 20 weeks of pregnancy. Body changes during your second trimester Your body continues to go through many changes during your second trimester. The changes vary and generally return to normal after the baby is born. Physical changes  Your weight will continue to increase. You will notice your lower abdomen bulging out.  You may begin to get stretch marks on your hips, abdomen, and breasts.  Your breasts will continue to grow and to become tender.  Dark spots or blotches (chloasma or mask of pregnancy) may develop on your face.  A dark line from your belly button to the pubic area (linea nigra) may appear.  You may have changes in your hair. These can include thickening of your hair, rapid growth, and changes in texture. Some people also have hair loss during or after pregnancy, or hair that feels dry or thin. Health changes  You may develop headaches.  You may have heartburn.  You may develop constipation.  You may develop hemorrhoids or swollen, bulging veins (varicose veins).  Your gums may bleed and may be sensitive to brushing and flossing.  You may urinate more often because the fetus is pressing on your bladder.  You may have back pain. This is caused by: ? Weight gain. ? Pregnancy hormones that  are relaxing the joints in your pelvis. ? A shift in weight and the muscles that support your balance. Follow these instructions at home: Medicines  Follow your health care provider's instructions regarding medicine use. Specific medicines may be either safe or unsafe to take during pregnancy. Do not take any medicines unless approved by your health care provider.  Take a prenatal vitamin that contains at least 600 micrograms (mcg) of folic acid. Eating and drinking  Eat a healthy diet that includes fresh fruits and vegetables, whole grains, good sources of protein such as meat, eggs, or tofu, and low-fat dairy products.  Avoid raw meat and unpasteurized juice, milk, and cheese. These carry germs that can harm you and your baby.  You may need to take these actions to prevent or treat constipation: ? Drink enough fluid to keep your urine pale yellow. ? Eat foods that are high in fiber, such as beans, whole grains, and fresh fruits and vegetables. ? Limit foods that are high in fat and processed sugars, such as fried or sweet foods. Activity  Exercise only as directed by your health care provider. Most people can continue their usual exercise routine during pregnancy. Try to exercise for 30 minutes at least 5 days a week. Stop exercising if you develop contractions in your uterus.  Stop exercising if you develop pain or cramping in the lower abdomen or lower back.  Avoid exercising if it is very hot or humid or if you are at  a high altitude.  Avoid heavy lifting.  If you choose to, you may have sex unless your health care provider tells you not to. Relieving pain and discomfort  Wear a supportive bra to prevent discomfort from breast tenderness.  Take warm sitz baths to soothe any pain or discomfort caused by hemorrhoids. Use hemorrhoid cream if your health care provider approves.  Rest with your legs raised (elevated) if you have leg cramps or low back pain.  If you develop  varicose veins: ? Wear support hose as told by your health care provider. ? Elevate your feet for 15 minutes, 3-4 times a day. ? Limit salt in your diet. Safety  Wear your seat belt at all times when driving or riding in a car.  Talk with your health care provider if someone is verbally or physically abusive to you. Lifestyle  Do not use hot tubs, steam rooms, or saunas.  Do not douche. Do not use tampons or scented sanitary pads.  Avoid cat litter boxes and soil used by cats. These carry germs that can cause birth defects in the baby and possibly loss of the fetus by miscarriage or stillbirth.  Do not use herbal remedies, alcohol, illegal drugs, or medicines that are not approved by your health care provider. Chemicals in these products can harm your baby.  Do not use any products that contain nicotine or tobacco, such as cigarettes, e-cigarettes, and chewing tobacco. If you need help quitting, ask your health care provider. General instructions  During a routine prenatal visit, your health care provider will do a physical exam and other tests. He or she will also discuss your overall health. Keep all follow-up visits. This is important.  Ask your health care provider for a referral to a local prenatal education class.  Ask for help if you have counseling or nutritional needs during pregnancy. Your health care provider can offer advice or refer you to specialists for help with various needs. Where to find more information  American Pregnancy Association: americanpregnancy.[place] and Gynecologists: PoolDevices.com.pt  Office on [employer]: KeywordPortfolios.com.br Contact a health care provider if you have:  A headache that does not go away when you take medicine.  Vision changes or you see spots in front of your eyes.  Mild pelvic cramps, pelvic pressure, or nagging pain in the abdominal area.  Persistent nausea,  vomiting, or diarrhea.  A bad-smelling vaginal discharge or foul-smelling urine.  Pain when you urinate.  Sudden or extreme swelling of your face, hands, ankles, feet, or legs.  A fever. Get help right away if you:  Have fluid leaking from your vagina.  Have spotting or bleeding from your vagina.  Have severe abdominal cramping or pain.  Have difficulty breathing.  Have chest pain.  Have fainting spells.  Have not felt your baby move for the time period told by your health care provider.  Have new or increased pain, swelling, or redness in an arm or leg. Summary  The second trimester of pregnancy is from week 13 through week 27 (months 4 through 6).  Do not use herbal remedies, alcohol, illegal drugs, or medicines that are not approved by your health care provider. Chemicals in these products can harm your baby.  Exercise only as directed by your health care provider. Most people can continue their usual exercise routine during pregnancy.  Keep all follow-up visits. This is important. This information is not intended to replace advice given to you by your  health care provider. Make sure you discuss any questions you have with your health care provider. Document Revised: 01/21/2020 Document Reviewed: 11/27/2019 Elsevier Patient Education  2021 [employer].

## 2020-11-10 NOTE — Progress Notes (Signed)
   PRENATAL VISIT NOTE  Subjective:  Sarah Estrada is a 40 y.o. G1P0 at [redacted]w[redacted]d being seen today for ongoing prenatal care.  She is currently monitored for the following issues for this low-risk pregnancy and has Supervision of high risk primigravida of advanced maternal age, antepartum; Hypothyroidism affecting pregnancy; and Obsessive-compulsive disorder on their problem list.  Patient reports no complaints. Reports taking Tums as needed for heartburn.  On Zoloft now for OCD, weaning off Lexapro and Zyban.   Contractions: Not present. Vag. Bleeding: None.  Movement: Absent. Denies leaking of fluid.   The following portions of the patient's history were reviewed and updated as appropriate: allergies, current medications, past family history, past medical history, past social history, past surgical history and problem list.   Objective:   Vitals:   11/10/20 1354  BP: 120/65  Pulse: 82  Weight: 168 lb 1.6 oz (76.2 kg)    Fetal Status: Fetal Heart Rate (bpm): 145   Movement: Absent     General:  Alert, oriented and cooperative. Patient is in no acute distress.  Skin: Skin is warm and dry. No rash noted.   Cardiovascular: Normal heart rate noted  Respiratory: Normal respiratory effort, no problems with respiration noted  Abdomen: Soft, gravid, appropriate for gestational age.  Pain/Pressure: Absent     Pelvic: Cervical exam deferred        Extremities: Normal range of motion.  Edema: None  Mental Status: Normal mood and affect. Normal behavior. Normal judgment and thought content.   Assessment and Plan:  Pregnancy: G1P0 at [redacted]w[redacted]d 1. Hypothyroidism affecting pregnancy in second trimester On Synthroid. Stable recent TSH.  2. Elevated ALT measurement ALT was 42 on initial labs, will recheck today. - ALT+AST+TBili  3. Obsessive-compulsive disorder, unspecified type Continue Zoloft as per mental health provider  4. [redacted] weeks gestation of pregnancy 5. Supervision of high risk  primigravida of advanced maternal age, antepartum Low risk NIPS.  AFP only today. Anatomy scan already scheduled at [facility]. - AFP, Serum, Open Spina Bifida Advised to take Pepcid or Zantac if Tums do not work for heartburn. No other complaints or concerns.  Routine obstetric precautions reviewed.  Please refer to After Visit Summary for other counseling recommendations.   Return in about 4 weeks (around 12/08/2020) for OFFICE OB VISIT (MD only), can be virtual if patient desires.  Future Appointments  Date Time Provider [place]  11/23/2020  2:00 PM [facility] NURSE [facility]  11/23/2020  2:15 PM [facility] US2 [facility] [place]    [name], MD

## 2020-11-12 LAB — AFP, SERUM, OPEN SPINA BIFIDA
AFP MoM: 1.17
AFP Value: 43.6 ng/mL
Gest. Age on Collection Date: 17.3 weeks
Maternal Age At EDD: 37.7 yr
OSBR Risk 1 IN: 7137
Test Results:: NEGATIVE
Weight: 168 [lb_av]

## 2020-11-12 LAB — ALT+AST+TBILI
ALT: 91 IU/L — ABNORMAL HIGH (ref 0–32)
AST: 49 IU/L — ABNORMAL HIGH (ref 0–40)
Bilirubin Total: <0.2 mg/dL (ref 0.0–1.2)

## 2020-11-14 ENCOUNTER — Telehealth: Payer: Self-pay | Admitting: Obstetrics & Gynecology

## 2020-11-14 ENCOUNTER — Encounter: Payer: Self-pay | Admitting: Obstetrics & Gynecology

## 2020-11-14 DIAGNOSIS — Z3A17 17 weeks gestation of pregnancy: Secondary | ICD-10-CM

## 2020-11-14 DIAGNOSIS — R7989 Other specified abnormal findings of blood chemistry: Secondary | ICD-10-CM | POA: Insufficient documentation

## 2020-11-14 NOTE — Telephone Encounter (Signed)
     Faculty Practice OB/GYN Physician Phone Call Documentation  I had a phone conversation with Sarah Estrada about her recent results with elevated LFTs. Mild elevation of ALT noted on initial OB labs, repeat labs at 17 weeks are shown below. Of note, had negative Hep B and Hep C screening.  Hepatic Function Latest Ref Rng & Units 11/10/2020 10/15/2020  Total Protein 6.0 - 8.5 g/dL - 6.3  Albumin 3.8 - 4.8 g/dL - 4.0  AST 0 - 40 IU/L 49(H) 22  ALT 0 - 32 IU/L 91(H) 42(H)  Alk Phosphatase 44 - 121 IU/L - 54  Total Bilirubin 0.0 - 1.2 mg/dL <0.2 0.3    Currently at [redacted]w[redacted]d. No nausea, vomiting reported.  Has not been taking Tylenol, or other non prescribed medications.  Also denies alcohol use.  Elevated LFTs are not reported side effects of her Escitalopram and Bupropion. No respiratory symptoms, has received three doses of Pfizer COVID vaccine.    Given elevation, will check liver ultrasound (see algorithm above, copied and pasted from UpToDate).  Will follow up results and manage accordingly.  May need to check more labs later.   Discussed plan with patient who agreed with this course of action.  [place] office staff notified of need for scheduling ultrasound.     [name], MD, [place] for [employer], [place]

## 2020-11-16 ENCOUNTER — Other Ambulatory Visit (HOSPITAL_BASED_OUTPATIENT_CLINIC_OR_DEPARTMENT_OTHER): Payer: Self-pay

## 2020-11-17 ENCOUNTER — Ambulatory Visit (HOSPITAL_COMMUNITY)
Admission: RE | Admit: 2020-11-17 | Discharge: 2020-11-17 | Disposition: A | Discharge status: Home / Self Care | Payer: 59 | Source: Ambulatory Visit | Attending: Obstetrics & Gynecology | Admitting: Obstetrics & Gynecology

## 2020-11-17 ENCOUNTER — Other Ambulatory Visit: Payer: Self-pay

## 2020-11-17 DIAGNOSIS — R7989 Other specified abnormal findings of blood chemistry: Secondary | ICD-10-CM | POA: Diagnosis not present

## 2020-11-17 DIAGNOSIS — Z3A17 17 weeks gestation of pregnancy: Secondary | ICD-10-CM | POA: Insufficient documentation

## 2020-11-22 ENCOUNTER — Ambulatory Visit: Payer: 59

## 2020-11-22 ENCOUNTER — Other Ambulatory Visit: Payer: 59

## 2020-11-23 ENCOUNTER — Other Ambulatory Visit: Payer: Self-pay | Admitting: *Deleted

## 2020-11-23 ENCOUNTER — Encounter: Payer: Self-pay | Admitting: Obstetrics & Gynecology

## 2020-11-23 ENCOUNTER — Encounter: Payer: Self-pay | Admitting: *Deleted

## 2020-11-23 ENCOUNTER — Ambulatory Visit (HOSPITAL_BASED_OUTPATIENT_CLINIC_OR_DEPARTMENT_OTHER): Payer: 59

## 2020-11-23 ENCOUNTER — Ambulatory Visit: Payer: 59 | Attending: Obstetrics and Gynecology | Admitting: *Deleted

## 2020-11-23 ENCOUNTER — Other Ambulatory Visit: Payer: Self-pay

## 2020-11-23 DIAGNOSIS — O99282 Endocrine, nutritional and metabolic diseases complicating pregnancy, second trimester: Secondary | ICD-10-CM | POA: Insufficient documentation

## 2020-11-23 DIAGNOSIS — O09522 Supervision of elderly multigravida, second trimester: Secondary | ICD-10-CM | POA: Diagnosis not present

## 2020-11-23 DIAGNOSIS — O09519 Supervision of elderly primigravida, unspecified trimester: Secondary | ICD-10-CM

## 2020-11-23 DIAGNOSIS — E039 Hypothyroidism, unspecified: Secondary | ICD-10-CM | POA: Insufficient documentation

## 2020-11-23 DIAGNOSIS — R7989 Other specified abnormal findings of blood chemistry: Secondary | ICD-10-CM

## 2020-11-23 DIAGNOSIS — O43199 Other malformation of placenta, unspecified trimester: Secondary | ICD-10-CM | POA: Insufficient documentation

## 2020-11-23 DIAGNOSIS — Z3A19 19 weeks gestation of pregnancy: Secondary | ICD-10-CM | POA: Diagnosis not present

## 2020-11-23 DIAGNOSIS — O0992 Supervision of high risk pregnancy, unspecified, second trimester: Secondary | ICD-10-CM | POA: Diagnosis not present

## 2020-12-08 ENCOUNTER — Encounter: Payer: Self-pay | Admitting: Family Medicine

## 2020-12-08 ENCOUNTER — Other Ambulatory Visit (HOSPITAL_COMMUNITY): Payer: Self-pay

## 2020-12-08 ENCOUNTER — Telehealth (INDEPENDENT_AMBULATORY_CARE_PROVIDER_SITE_OTHER): Payer: 59 | Admitting: Family Medicine

## 2020-12-08 VITALS — BP 96/55

## 2020-12-08 DIAGNOSIS — R7989 Other specified abnormal findings of blood chemistry: Secondary | ICD-10-CM

## 2020-12-08 DIAGNOSIS — O43199 Other malformation of placenta, unspecified trimester: Secondary | ICD-10-CM

## 2020-12-08 DIAGNOSIS — O99891 Other specified diseases and conditions complicating pregnancy: Secondary | ICD-10-CM

## 2020-12-08 DIAGNOSIS — O43192 Other malformation of placenta, second trimester: Secondary | ICD-10-CM

## 2020-12-08 DIAGNOSIS — O09512 Supervision of elderly primigravida, second trimester: Secondary | ICD-10-CM

## 2020-12-08 DIAGNOSIS — Z6826 Body mass index (BMI) 26.0-26.9, adult: Secondary | ICD-10-CM | POA: Diagnosis not present

## 2020-12-08 DIAGNOSIS — F429 Obsessive-compulsive disorder, unspecified: Secondary | ICD-10-CM | POA: Diagnosis not present

## 2020-12-08 DIAGNOSIS — Z3A21 21 weeks gestation of pregnancy: Secondary | ICD-10-CM

## 2020-12-08 DIAGNOSIS — O09519 Supervision of elderly primigravida, unspecified trimester: Secondary | ICD-10-CM

## 2020-12-08 DIAGNOSIS — E039 Hypothyroidism, unspecified: Secondary | ICD-10-CM

## 2020-12-08 DIAGNOSIS — O99342 Other mental disorders complicating pregnancy, second trimester: Secondary | ICD-10-CM

## 2020-12-08 DIAGNOSIS — O99282 Endocrine, nutritional and metabolic diseases complicating pregnancy, second trimester: Secondary | ICD-10-CM

## 2020-12-08 MED ORDER — SERTRALINE HCL 100 MG PO TABS
ORAL_TABLET | ORAL | 1 refills | Status: DC
  Filled 2020-12-08: qty 90, 90d supply, fill #0
  Filled 2021-03-07: qty 90, 90d supply, fill #1

## 2020-12-08 NOTE — Progress Notes (Signed)
I connected with  Sarah Estrada on 12/08/20 at  1:15 PM EDT by telephone and verified that I am speaking with the correct person using two identifiers.   I discussed the limitations, risks, security and privacy concerns of performing an evaluation and management service by telephone and the availability of in person appointments. I also discussed with the patient that there may be a patient responsible charge related to this service. The patient expressed understanding and agreed to proceed.  Georgia Lopes, RN 12/08/2020  1:21 PM  Time with pt 10 mins Pt at home  RN at [facility]

## 2020-12-08 NOTE — Progress Notes (Signed)
I connected with Sarah Estrada 12/08/20 at  1:15 PM EDT by: MyChart video and verified that I am speaking with the correct person using two identifiers.  Patient is located at work and provider is located at [facility] [facility].     The purpose of this virtual visit is to provide medical care while limiting exposure to the novel coronavirus. I discussed the limitations, risks, security and privacy concerns of performing an evaluation and management service by MyChart video and the availability of in person appointments. I also discussed with the patient that there may be a patient responsible charge related to this service. By engaging in this virtual visit, you consent to the provision of healthcare.  Additionally, you authorize for your insurance to be billed for the services provided during this visit.  The patient expressed understanding and agreed to proceed.  The following staff members participated in the virtual visit:  Clarnce Flock, MD    PRENATAL VISIT NOTE  Subjective:  Sarah Estrada is a 40 y.o. G1P0 at [redacted]w[redacted]d  for phone visit for ongoing prenatal care.  She is currently monitored for the following issues for this high-risk pregnancy and has Supervision of high risk primigravida of advanced maternal age, antepartum; Hypothyroidism affecting pregnancy; Obsessive-compulsive disorder; Elevated LFTs; and Marginal insertion of umbilical cord affecting management of mother on their problem list.  Patient reports no complaints.  Contractions: Not present. Vag. Bleeding: None.  Movement: Present. Denies leaking of fluid.   The following portions of the patient's history were reviewed and updated as appropriate: allergies, current medications, past family history, past medical history, past social history, past surgical history and problem list.   Objective:   Vitals:   12/08/20 1321  BP: (!) 96/55   Self-Obtained  Fetal Status:     Movement: Present     Assessment and  Plan:  Pregnancy: G1P0 at [redacted]w[redacted]d 1. Elevated LFTs Initial CMP with AST 22, ALT 42 on 10/15/20 Repeat with AST 49, ALT 91 on 11/10/2020 Then had unremarkable RUQ [place] Plan for recheck of CMP at next visit, would also send iron/ferritin levels to evaluate for hemochromatosis given ethnicity No suspicious meds Mild hypothyroidism but well controlled of late, might be worth sending ANA/ASMA as well  2. Encounter for supervision of high risk primigravida of advanced maternal age, antepartum BP normal, good fetal movement  3. Marginal insertion of umbilical cord affecting management of mother Seen on anatomy scan, has follow up US scheduled w [facility]  4. Hypothyroidism affecting pregnancy in second trimester Last TSH normal, recheck in 3rd trimester  5. Obsessive-compulsive disorder, unspecified type Not addressed  Preterm labor symptoms and general obstetric precautions including but not limited to vaginal bleeding, contractions, leaking of fluid and fetal movement were reviewed in detail with the patient.  Return in 4 weeks (on 01/05/2021) for [facility], ob visit.  Future Appointments  Date Time Provider [place]  12/28/2020  2:45 PM [facility] NURSE [facility]  12/28/2020  3:00 PM [facility] US1 [facility] [place]     Time spent on virtual visit: 10 minutes  Clarnce Flock, MD

## 2020-12-08 NOTE — Patient Instructions (Signed)
Contraception Choices Contraception, also called birth control, refers to methods or devices that prevent pregnancy. Hormonal methods Contraceptive implant A contraceptive implant is a thin, plastic tube that contains a hormone that prevents pregnancy. It is different from an intrauterine device (IUD). It is inserted into the upper part of the arm by a health care provider. Implants can be effective for up to 3 years. Progestin-only injections Progestin-only injections are injections of progestin, a synthetic form of the hormone progesterone. They are given every 3 months by a health care provider. Birth control pills Birth control pills are pills that contain hormones that prevent pregnancy. They must be taken once a day, preferably at the same time each day. A prescription is needed to use this method of contraception. Birth control patch The birth control patch contains hormones that prevent pregnancy. It is placed on the skin and must be changed once a week for three weeks and removed on the fourth week. A prescription is needed to use this method of contraception. Vaginal ring A vaginal ring contains hormones that prevent pregnancy. It is placed in the vagina for three weeks and removed on the fourth week. After that, the process is repeated with a new ring. A prescription is needed to use this method of contraception. Emergency contraceptive Emergency contraceptives prevent pregnancy after unprotected sex. They come in pill form and can be taken up to 5 days after sex. They work best the sooner they are taken after having sex. Most emergency contraceptives are available without a prescription. This method should not be used as your only form of birth control.   Barrier methods Female condom A female condom is a thin sheath that is worn over the penis during sex. Condoms keep sperm from going inside a woman's body. They can be used with a sperm-killing substance (spermicide) to increase their  effectiveness. They should be thrown away after one use. Female condom A female condom is a soft, loose-fitting sheath that is put into the vagina before sex. The condom keeps sperm from going inside a woman's body. They should be thrown away after one use. Diaphragm A diaphragm is a soft, dome-shaped barrier. It is inserted into the vagina before sex, along with a spermicide. The diaphragm blocks sperm from entering the uterus, and the spermicide kills sperm. A diaphragm should be left in the vagina for 6-8 hours after sex and removed within 24 hours. A diaphragm is prescribed and fitted by a health care provider. A diaphragm should be replaced every 1-2 years, after giving birth, after gaining more than 15 lb (6.8 kg), and after pelvic surgery. Cervical cap A cervical cap is a round, soft latex or plastic cup that fits over the cervix. It is inserted into the vagina before sex, along with spermicide. It blocks sperm from entering the uterus. The cap should be left in place for 6-8 hours after sex and removed within 48 hours. A cervical cap must be prescribed and fitted by a health care provider. It should be replaced every 2 years. Sponge A sponge is a soft, circular piece of polyurethane foam with spermicide in it. The sponge helps block sperm from entering the uterus, and the spermicide kills sperm. To use it, you make it wet and then insert it into the vagina. It should be inserted before sex, left in for at least 6 hours after sex, and removed and thrown away within 30 hours. Spermicides Spermicides are chemicals that kill or block sperm from entering the  cervix and uterus. They can come as a cream, jelly, suppository, foam, or tablet. A spermicide should be inserted into the vagina with an applicator at least 41-66 minutes before sex to allow time for it to work. The process must be repeated every time you have sex. Spermicides do not require a prescription.   Intrauterine  contraception Intrauterine device (IUD) An IUD is a T-shaped device that is put in a woman's uterus. There are two types:  Hormone IUD.This type contains progestin, a synthetic form of the hormone progesterone. This type can stay in place for 3-5 years.  Copper IUD.This type is wrapped in copper wire. It can stay in place for 10 years. Permanent methods of contraception Female tubal ligation In this method, a woman's fallopian tubes are sealed, tied, or blocked during surgery to prevent eggs from traveling to the uterus. Hysteroscopic sterilization In this method, a small, flexible insert is placed into each fallopian tube. The inserts cause scar tissue to form in the fallopian tubes and block them, so sperm cannot reach an egg. The procedure takes about 3 months to be effective. Another form of birth control must be used during those 3 months. Female sterilization This is a procedure to tie off the tubes that carry sperm (vasectomy). After the procedure, the man can still ejaculate fluid (semen). Another form of birth control must be used for 3 months after the procedure. Natural planning methods Natural family planning In this method, a couple does not have sex on days when the woman could become pregnant. Calendar method In this method, the woman keeps track of the length of each menstrual cycle, identifies the days when pregnancy can happen, and does not have sex on those days. Ovulation method In this method, a couple avoids sex during ovulation. Symptothermal method This method involves not having sex during ovulation. The woman typically checks for ovulation by watching changes in her temperature and in the consistency of cervical mucus. Post-ovulation method In this method, a couple waits to have sex until after ovulation. Where to find more information  Centers for Disease Control and Prevention: http://www.wolf.info/ Summary  Contraception, also called birth control, refers to methods or  devices that prevent pregnancy.  Hormonal methods of contraception include implants, injections, pills, patches, vaginal rings, and emergency contraceptives.  Barrier methods of contraception can include female condoms, female condoms, diaphragms, cervical caps, sponges, and spermicides.  There are two types of IUDs (intrauterine devices). An IUD can be put in a woman's uterus to prevent pregnancy for 3-5 years.  Permanent sterilization can be done through a procedure for males and females. Natural family planning methods involve nothaving sex on days when the woman could become pregnant. This information is not intended to replace advice given to you by your health care provider. Make sure you discuss any questions you have with your health care provider. Document Revised: 01/19/2020 Document Reviewed: 01/19/2020 Elsevier Patient Education  [employer].   Breastfeeding  Choosing to breastfeed is one of the best decisions you can make for yourself and your baby. A change in hormones during pregnancy causes your breasts to make breast milk in your milk-producing glands. Hormones prevent breast milk from being released before your baby is born. They also prompt milk flow after birth. Once breastfeeding has begun, thoughts of your baby, as well as his or her sucking or crying, can stimulate the release of milk from your milk-producing glands. Benefits of breastfeeding Research shows that breastfeeding offers many health benefits  for infants and mothers. It also offers a cost-free and convenient way to feed your baby. For your baby  Your first milk (colostrum) helps your baby's digestive system to function better.  Special cells in your milk (antibodies) help your baby to fight off infections.  Breastfed babies are less likely to develop asthma, allergies, obesity, or type 2 diabetes. They are also at lower risk for sudden infant death syndrome (SIDS).  Nutrients in breast milk are better  able to meet your baby's needs compared to infant formula.  Breast milk improves your baby's brain development. For you  Breastfeeding helps to create a very special bond between you and your baby.  Breastfeeding is convenient. Breast milk costs nothing and is always available at the correct temperature.  Breastfeeding helps to burn calories. It helps you to lose the weight that you gained during pregnancy.  Breastfeeding makes your uterus return faster to its size before pregnancy. It also slows bleeding (lochia) after you give birth.  Breastfeeding helps to lower your risk of developing type 2 diabetes, osteoporosis, rheumatoid arthritis, cardiovascular disease, and breast, ovarian, uterine, and endometrial cancer later in life. Breastfeeding basics Starting breastfeeding  Find a comfortable place to sit or lie down, with your neck and back well-supported.  Place a pillow or a rolled-up blanket under your baby to bring him or her to the level of your breast (if you are seated). Nursing pillows are specially designed to help support your arms and your baby while you breastfeed.  Make sure that your baby's tummy (abdomen) is facing your abdomen.  Gently massage your breast. With your fingertips, massage from the outer edges of your breast inward toward the nipple. This encourages milk flow. If your milk flows slowly, you may need to continue this action during the feeding.  Support your breast with 4 fingers underneath and your thumb above your nipple (make the letter "C" with your hand). Make sure your fingers are well away from your nipple and your baby's mouth.  Stroke your baby's lips gently with your finger or nipple.  When your baby's mouth is open wide enough, quickly bring your baby to your breast, placing your entire nipple and as much of the areola as possible into your baby's mouth. The areola is the colored area around your nipple. ? More areola should be visible above your  baby's upper lip than below the lower lip. ? Your baby's lips should be opened and extended outward (flanged) to ensure an adequate, comfortable latch. ? Your baby's tongue should be between his or her lower gum and your breast.  Make sure that your baby's mouth is correctly positioned around your nipple (latched). Your baby's lips should create a seal on your breast and be turned out (everted).  It is common for your baby to suck about 2-3 minutes in order to start the flow of breast milk. Latching Teaching your baby how to latch onto your breast properly is very important. An improper latch can cause nipple pain, decreased milk supply, and poor weight gain in your baby. Also, if your baby is not latched onto your nipple properly, he or she may swallow some air during feeding. This can make your baby fussy. Burping your baby when you switch breasts during the feeding can help to get rid of the air. However, teaching your baby to latch on properly is still the best way to prevent fussiness from swallowing air while breastfeeding. Signs that your baby has successfully latched onto  your nipple  Silent tugging or silent sucking, without causing you pain. Infant's lips should be extended outward (flanged).  Swallowing heard between every 3-4 sucks once your milk has started to flow (after your let-down milk reflex occurs).  Muscle movement above and in front of his or her ears while sucking. Signs that your baby has not successfully latched onto your nipple  Sucking sounds or smacking sounds from your baby while breastfeeding.  Nipple pain. If you think your baby has not latched on correctly, slip your finger into the corner of your baby's mouth to break the suction and place it between your baby's gums. Attempt to start breastfeeding again. Signs of successful breastfeeding Signs from your baby  Your baby will gradually decrease the number of sucks or will completely stop sucking.  Your baby  will fall asleep.  Your baby's body will relax.  Your baby will retain a small amount of milk in his or her mouth.  Your baby will let go of your breast by himself or herself. Signs from you  Breasts that have increased in firmness, weight, and size 1-3 hours after feeding.  Breasts that are softer immediately after breastfeeding.  Increased milk volume, as well as a change in milk consistency and color by the fifth day of breastfeeding.  Nipples that are not sore, cracked, or bleeding. Signs that your baby is getting enough milk  Wetting at least 1-2 diapers during the first 24 hours after birth.  Wetting at least 5-6 diapers every 24 hours for the first week after birth. The urine should be clear or pale yellow by the age of 5 days.  Wetting 6-8 diapers every 24 hours as your baby continues to grow and develop.  At least 3 stools in a 24-hour period by the age of 5 days. The stool should be soft and yellow.  At least 3 stools in a 24-hour period by the age of 7 days. The stool should be seedy and yellow.  No loss of weight greater than 10% of birth weight during the first 3 days of life.  Average weight gain of 4-7 oz (113-198 g) per week after the age of 4 days.  Consistent daily weight gain by the age of 5 days, without weight loss after the age of 2 weeks. After a feeding, your baby may spit up a small amount of milk. This is normal. Breastfeeding frequency and duration Frequent feeding will help you make more milk and can prevent sore nipples and extremely full breasts (breast engorgement). Breastfeed when you feel the need to reduce the fullness of your breasts or when your baby shows signs of hunger. This is called "breastfeeding on demand." Signs that your baby is hungry include:  Increased alertness, activity, or restlessness.  Movement of the head from side to side.  Opening of the mouth when the corner of the mouth or cheek is stroked (rooting).  Increased  sucking sounds, smacking lips, cooing, sighing, or squeaking.  Hand-to-mouth movements and sucking on fingers or hands.  Fussing or crying. Avoid introducing a pacifier to your baby in the first 4-6 weeks after your baby is born. After this time, you may choose to use a pacifier. Research has shown that pacifier use during the first year of a baby's life decreases the risk of sudden infant death syndrome (SIDS). Allow your baby to feed on each breast as long as he or she wants. When your baby unlatches or falls asleep while feeding from the  first breast, offer the second breast. Because newborns are often sleepy in the first few weeks of life, you may need to awaken your baby to get him or her to feed. Breastfeeding times will vary from baby to baby. However, the following rules can serve as a guide to help you make sure that your baby is properly fed:  Newborns (babies 33 weeks of age or younger) may breastfeed every 1-3 hours.  Newborns should not go without breastfeeding for longer than 3 hours during the day or 5 hours during the night.  You should breastfeed your baby a minimum of 8 times in a 24-hour period. Breast milk pumping Pumping and storing breast milk allows you to make sure that your baby is exclusively fed your breast milk, even at times when you are unable to breastfeed. This is especially important if you go back to work while you are still breastfeeding, or if you are not able to be present during feedings. Your lactation consultant can help you find a method of pumping that works best for you and give you guidelines about how long it is safe to store breast milk.      Caring for your breasts while you breastfeed Nipples can become dry, cracked, and sore while breastfeeding. The following recommendations can help keep your breasts moisturized and healthy:  Avoid using soap on your nipples.  Wear a supportive bra designed especially for nursing. Avoid wearing underwire-style  bras or extremely tight bras (sports bras).  Air-dry your nipples for 3-4 minutes after each feeding.  Use only cotton bra pads to absorb leaked breast milk. Leaking of breast milk between feedings is normal.  Use lanolin on your nipples after breastfeeding. Lanolin helps to maintain your skin's normal moisture barrier. Pure lanolin is not harmful (not toxic) to your baby. You may also hand express a few drops of breast milk and gently massage that milk into your nipples and allow the milk to air-dry. In the first few weeks after giving birth, some women experience breast engorgement. Engorgement can make your breasts feel heavy, warm, and tender to the touch. Engorgement peaks within 3-5 days after you give birth. The following recommendations can help to ease engorgement:  Completely empty your breasts while breastfeeding or pumping. You may want to start by applying warm, moist heat (in the shower or with warm, water-soaked hand towels) just before feeding or pumping. This increases circulation and helps the milk flow. If your baby does not completely empty your breasts while breastfeeding, pump any extra milk after he or she is finished.  Apply ice packs to your breasts immediately after breastfeeding or pumping, unless this is too uncomfortable for you. To do this: ? Put ice in a plastic bag. ? Place a towel between your skin and the bag. ? Leave the ice on for 20 minutes, 2-3 times a day.  Make sure that your baby is latched on and positioned properly while breastfeeding. If engorgement persists after 48 hours of following these recommendations, contact your health care provider or a Science writer. Overall health care recommendations while breastfeeding  Eat 3 healthy meals and 3 snacks every day. Well-nourished mothers who are breastfeeding need an additional 450-500 calories a day. You can meet this requirement by increasing the amount of a balanced diet that you eat.  Drink  enough water to keep your urine pale yellow or clear.  Rest often, relax, and continue to take your prenatal vitamins to prevent fatigue, stress, and low  vitamin and mineral levels in your body (nutrient deficiencies).  Do not use any products that contain nicotine or tobacco, such as cigarettes and e-cigarettes. Your baby may be harmed by chemicals from cigarettes that pass into breast milk and exposure to secondhand smoke. If you need help quitting, ask your health care provider.  Avoid alcohol.  Do not use illegal drugs or marijuana.  Talk with your health care provider before taking any medicines. These include over-the-counter and prescription medicines as well as vitamins and herbal supplements. Some medicines that may be harmful to your baby can pass through breast milk.  It is possible to become pregnant while breastfeeding. If birth control is desired, ask your health care provider about options that will be safe while breastfeeding your baby. Where to find more information: [employer]: www.llli.org Contact a health care provider if:  You feel like you want to stop breastfeeding or have become frustrated with breastfeeding.  Your nipples are cracked or bleeding.  Your breasts are red, tender, or warm.  You have: ? Painful breasts or nipples. ? A swollen area on either breast. ? A fever or chills. ? Nausea or vomiting. ? Drainage other than breast milk from your nipples.  Your breasts do not become full before feedings by the fifth day after you give birth.  You feel sad and depressed.  Your baby is: ? Too sleepy to eat well. ? Having trouble sleeping. ? More than 64 week old and wetting fewer than 6 diapers in a 24-hour period. ? Not gaining weight by 52 days of age.  Your baby has fewer than 3 stools in a 24-hour period.  Your baby's skin or the white parts of his or her eyes become yellow. Get help right away if:  Your baby is overly tired  (lethargic) and does not want to wake up and feed.  Your baby develops an unexplained fever. Summary  Breastfeeding offers many health benefits for infant and mothers.  Try to breastfeed your infant when he or she shows early signs of hunger.  Gently tickle or stroke your baby's lips with your finger or nipple to allow the baby to open his or her mouth. Bring the baby to your breast. Make sure that much of the areola is in your baby's mouth. Offer one side and burp the baby before you offer the other side.  Talk with your health care provider or lactation consultant if you have questions or you face problems as you breastfeed. This information is not intended to replace advice given to you by your health care provider. Make sure you discuss any questions you have with your health care provider. Document Revised: 11/08/2017 Document Reviewed: 09/15/2016 Elsevier Patient Education  2021 [employer].

## 2020-12-10 ENCOUNTER — Other Ambulatory Visit (HOSPITAL_COMMUNITY): Payer: Self-pay

## 2020-12-10 MED FILL — Levothyroxine Sodium Tab 75 MCG: ORAL | 90 days supply | Qty: 90 | Fill #0 | Status: AC

## 2020-12-11 ENCOUNTER — Other Ambulatory Visit (HOSPITAL_COMMUNITY): Payer: Self-pay

## 2020-12-14 ENCOUNTER — Other Ambulatory Visit (HOSPITAL_COMMUNITY): Payer: Self-pay

## 2020-12-23 DIAGNOSIS — R7989 Other specified abnormal findings of blood chemistry: Secondary | ICD-10-CM

## 2020-12-28 ENCOUNTER — Encounter: Payer: Self-pay | Admitting: *Deleted

## 2020-12-28 ENCOUNTER — Other Ambulatory Visit: Payer: 59

## 2020-12-28 ENCOUNTER — Other Ambulatory Visit: Payer: Self-pay | Admitting: *Deleted

## 2020-12-28 ENCOUNTER — Other Ambulatory Visit: Payer: Self-pay

## 2020-12-28 ENCOUNTER — Ambulatory Visit: Payer: 59 | Attending: Obstetrics

## 2020-12-28 ENCOUNTER — Ambulatory Visit: Payer: 59 | Admitting: *Deleted

## 2020-12-28 DIAGNOSIS — E039 Hypothyroidism, unspecified: Secondary | ICD-10-CM

## 2020-12-28 DIAGNOSIS — O09519 Supervision of elderly primigravida, unspecified trimester: Secondary | ICD-10-CM

## 2020-12-28 DIAGNOSIS — O09522 Supervision of elderly multigravida, second trimester: Secondary | ICD-10-CM | POA: Diagnosis not present

## 2020-12-28 DIAGNOSIS — O43199 Other malformation of placenta, unspecified trimester: Secondary | ICD-10-CM | POA: Insufficient documentation

## 2020-12-28 DIAGNOSIS — O321XX0 Maternal care for breech presentation, not applicable or unspecified: Secondary | ICD-10-CM

## 2020-12-28 DIAGNOSIS — Z3A24 24 weeks gestation of pregnancy: Secondary | ICD-10-CM | POA: Diagnosis not present

## 2020-12-28 DIAGNOSIS — R7989 Other specified abnormal findings of blood chemistry: Secondary | ICD-10-CM | POA: Diagnosis not present

## 2020-12-28 DIAGNOSIS — O3692X0 Maternal care for fetal problem, unspecified, second trimester, not applicable or unspecified: Secondary | ICD-10-CM | POA: Diagnosis not present

## 2020-12-28 DIAGNOSIS — O99282 Endocrine, nutritional and metabolic diseases complicating pregnancy, second trimester: Secondary | ICD-10-CM

## 2020-12-28 DIAGNOSIS — O09529 Supervision of elderly multigravida, unspecified trimester: Secondary | ICD-10-CM

## 2020-12-29 LAB — COMPREHENSIVE METABOLIC PANEL
ALT: 82 IU/L — ABNORMAL HIGH (ref 0–32)
AST: 46 IU/L — ABNORMAL HIGH (ref 0–40)
Albumin/Globulin Ratio: 1.4 (ref 1.2–2.2)
Albumin: 3.5 g/dL — ABNORMAL LOW (ref 3.8–4.8)
Alkaline Phosphatase: 57 IU/L (ref 44–121)
BUN/Creatinine Ratio: 29 — ABNORMAL HIGH (ref 9–23)
BUN: 14 mg/dL (ref 6–20)
Bilirubin Total: <0.2 mg/dL (ref 0.0–1.2)
CO2: 19 mmol/L — ABNORMAL LOW (ref 20–29)
Calcium: 8.9 mg/dL (ref 8.7–10.2)
Chloride: 102 mmol/L (ref 96–106)
Creatinine, Ser: 0.48 mg/dL — ABNORMAL LOW (ref 0.57–1.00)
Globulin, Total: 2.5 g/dL (ref 1.5–4.5)
Glucose: 100 mg/dL — ABNORMAL HIGH (ref 65–99)
Potassium: 4.1 mmol/L (ref 3.5–5.2)
Sodium: 134 mmol/L (ref 134–144)
Total Protein: 6 g/dL (ref 6.0–8.5)
eGFR: 125 mL/min/{1.73_m2} (ref >59)

## 2020-12-29 LAB — MITOCHONDRIAL ANTIBODIES: Mitochondrial Ab: 21.2 Units — ABNORMAL HIGH (ref 0.0–20.0)

## 2020-12-29 LAB — CERULOPLASMIN: Ceruloplasmin: 42.1 mg/dL — ABNORMAL HIGH (ref 19.0–39.0)

## 2020-12-29 LAB — ANTI-SMOOTH MUSCLE ANTIBODY, IGG: Smooth Muscle Ab: 7 Units (ref 0–19)

## 2020-12-29 LAB — IRON: Iron: 61 ug/dL (ref 27–159)

## 2020-12-29 LAB — BILE ACIDS, TOTAL: Bile Acids Total: 3.9 umol/L (ref 0.0–10.0)

## 2020-12-29 LAB — FERRITIN: Ferritin: 20 ng/mL (ref 15–150)

## 2020-12-29 LAB — ANA: Anti Nuclear Antibody (ANA): NEGATIVE

## 2021-01-05 ENCOUNTER — Encounter: Payer: Self-pay | Admitting: Obstetrics & Gynecology

## 2021-01-05 ENCOUNTER — Ambulatory Visit (INDEPENDENT_AMBULATORY_CARE_PROVIDER_SITE_OTHER): Payer: 59 | Admitting: Obstetrics & Gynecology

## 2021-01-05 ENCOUNTER — Other Ambulatory Visit: Payer: Self-pay

## 2021-01-05 VITALS — BP 101/60 | HR 65 | Wt 176.2 lb

## 2021-01-05 DIAGNOSIS — O09519 Supervision of elderly primigravida, unspecified trimester: Secondary | ICD-10-CM

## 2021-01-05 DIAGNOSIS — E039 Hypothyroidism, unspecified: Secondary | ICD-10-CM

## 2021-01-05 DIAGNOSIS — R7989 Other specified abnormal findings of blood chemistry: Secondary | ICD-10-CM

## 2021-01-05 DIAGNOSIS — Z3A25 25 weeks gestation of pregnancy: Secondary | ICD-10-CM

## 2021-01-05 DIAGNOSIS — O99282 Endocrine, nutritional and metabolic diseases complicating pregnancy, second trimester: Secondary | ICD-10-CM

## 2021-01-05 NOTE — Progress Notes (Signed)
PRENATAL VISIT NOTE  Subjective:  Sarah Estrada is a 40 y.o. G1P0 at 66w2dbeing seen today for ongoing prenatal care.  She is currently monitored for the following issues for this high-risk pregnancy and has Supervision of high risk primigravida of advanced maternal age, antepartum; Hypothyroidism affecting pregnancy; Obsessive-compulsive disorder; Elevated LFTs; and Marginal insertion of umbilical cord affecting management of mother on their problem list.  Patient reports no complaints.  Contractions: Not present. Vag. Bleeding: None.  Movement: Present. Denies leaking of fluid.   The following portions of the patient's history were reviewed and updated as appropriate: allergies, current medications, past family history, past medical history, past social history, past surgical history and problem list.   Objective:   Vitals:   01/05/21 1510  BP: 101/60  Pulse: 65  Weight: 176 lb 3.2 oz (79.9 kg)    Fetal Status: Fetal Heart Rate (bpm): 159 Fundal Height: 25 cm Movement: Present     General:  Alert, oriented and cooperative. Patient is in no acute distress.  Skin: Skin is warm and dry. No rash noted.   Cardiovascular: Normal heart rate noted  Respiratory: Normal respiratory effort, no problems with respiration noted  Abdomen: Soft, gravid, appropriate for gestational age.  Pain/Pressure: Absent     Pelvic: Cervical exam deferred        Extremities: Normal range of motion.  Edema: None  Mental Status: Normal mood and affect. Normal behavior. Normal judgment and thought content.   Imaging: [place]FM OB FOLLOW UP  Result Date: 12/28/2020 ----------------------------------------------------------------------  OBSTETRICS REPORT                       (Signed Final 12/28/2020 04:09 pm) ---------------------------------------------------------------------- Patient Info  ID #:       [id number]                         D.O.B.:  110-08-1980(39 yrs)  Name:       Sarah Estrada              Visit Date: 12/28/2020 03:00 pm ---------------------------------------------------------------------- Performed By  Attending:        VJohnell ComingsMD         Ref. Address:     [address]                                                            [place] Performed By:     DClaudia Desanctis      [facility]  [facility]                                                             [place] [facility]                                                             [facility]  Referred By:      Osborne Oman MD ---------------------------------------------------------------------- Orders  #  Description                           Code        Ordered By  1  [place] MFM OB FOLLOW UP                   [phone]    Peterson Ao ----------------------------------------------------------------------  #  Order #                     Accession #                Episode #  1  [id number]                   [id number]                 [id number] ---------------------------------------------------------------------- Indications  Advanced maternal age multigravida 28+,        O5.522  second trimester  Fetal and placental problem affecting          O36.90X0  management of mother, antepartum  Hypothyroid - synthroid                        O99.280 E03.9  [redacted] weeks gestation of pregnancy                Z3A.24  LR NIPS, Neg AFP ---------------------------------------------------------------------- Fetal Evaluation  Num Of Fetuses:         1  Cardiac Activity:       Observed  Presentation:           Breech  Placenta:               Posterior  P. Cord Insertion:      Visualized  Amniotic Fluid  AFI FV:      Within normal limits                              Largest Pocket(cm)  6.49  Comment:     Placental cord insertion appears eccentric but does not appear              to be marginal this exam. ---------------------------------------------------------------------- Biometry  BPD:      60.9  mm     G. Age:  24w 5d         67  %    CI:        75.53   %    70 - 86                                                          FL/HC:      20.2   %    18.7 - 20.9  HC:      222.2  mm     G. Age:  24w 2d         35  %    HC/AC:      1.11        1.05 - 1.21  AC:      199.6  mm     G. Age:  24w 4d         56  %    FL/BPD:     73.6   %    71 - 87  FL:       44.8  mm     G. Age:  24w 5d         58  %    FL/AC:      22.4   %    20 - 24  LV:        6.4  mm  Est. FW:     719  gm      1 lb 9 oz     65  % ---------------------------------------------------------------------- OB History  Gravidity:    1 ---------------------------------------------------------------------- Gestational Age  LMP:           24w 1d        Date:  07/12/20                 EDD:   04/18/21  U/S Today:     24w 4d                                        EDD:   04/15/21  Best:          24w 1d     Det. By:  LMP  (07/12/20)          EDD:   04/18/21 ---------------------------------------------------------------------- Anatomy  Cranium:               Appears normal         Heart:                  Appears normal                                                                        (  4CH, axis, and                                                                        situs)  Cavum:                 Appears normal         RVOT:                   Appears normal  Ventricles:            Appears normal         LVOT:                   Appears normal  Choroid Plexus:        Appears normal         Aortic Arch:            Appears normal  Cerebellum:            Appears normal         Stomach:                Appears normal, left                                                                        sided  Posterior Fossa:       Appears normal         Kidneys:                 Appear normal  Face:                  Appears normal         Bladder:                Appears normal                         (orbits and profile)  Lips:                  Appears normal ---------------------------------------------------------------------- Cervix Uterus Adnexa  Cervix  Length:           3.08  cm.  Normal appearance by transabdominal scan. ---------------------------------------------------------------------- Comments  This patient was seen for a follow up growth scan due to  hypothyroidism and a marginal placental cord insertion that  was noted during her prior ultrasound exam.  She denies any  problems since her last exam.  She was informed that the fetal growth and amniotic fluid  level appears appropriate for her gestational age.  A follow up exam was scheduled in 8 weeks to assess the  fetal growth. ----------------------------------------------------------------------                   Johnell Comings, MD Electronically Signed Final Report   12/28/2020 04:09 pm ----------------------------------------------------------------------   Assessment and Plan:  Pregnancy: G1P0 at 36w2d1. Elevated LFTs Hepatic  Function Latest Ref Rng & Units 12/28/2020 11/10/2020 10/15/2020  Total Protein 6.0 - 8.5 g/dL 6.0 - 6.3  Albumin 3.8 - 4.8 g/dL 3.5(L) - 4.0  AST 0 - 40 IU/L 46(H) 49(H) 22  ALT 0 - 32 IU/L 82(H) 91(H) 42(H)  Alk Phosphatase 44 - 121 IU/L 57 - 54  Total Bilirubin 0.0 - 1.2 mg/dL <0.2 <0.2 0.3  Negative extensive evaluation. Slightly decreasing on last check. Recheck next visit.  2. Hypothyroidism affecting pregnancy in second trimester Continue Synthroid 75 mcg daily.  Check TSH next visit.  3. [redacted] weeks gestation of pregnancy 4. Supervision of high risk primigravida of advanced maternal age, antepartum No other concerns. Preterm labor symptoms and general obstetric precautions including but not limited to vaginal bleeding, contractions, leaking of fluid and fetal movement were  reviewed in detail with the patient. Please refer to After Visit Summary for other counseling recommendations.   Return in about 3 weeks (around 01/26/2021) for 2 hr GTT, 3rd trimester labs, CMET, TSH, TDap, OFFICE OB VISIT (MD only).  Future Appointments  Date Time Provider [place]  02/23/2021  2:45 PM [facility] NURSE [facility]  02/23/2021  3:00 PM [facility] US1 [facility] [place]    [name], MD

## 2021-01-05 NOTE — Patient Instructions (Addendum)
Return to office for any scheduled appointments. Call the office or go to the MAU at [facility] [place] if:  You begin to have strong, frequent contractions  Your water breaks.  Sometimes it is a big gush of fluid, sometimes it is just a trickle that keeps getting your panties wet or running down your legs  You have vaginal bleeding.  It is normal to have a small amount of spotting if your cervix was checked.   You do not feel your baby moving like normal.  If you do not, get something to eat and drink and lay down and focus on feeling your baby move.   If your baby is still not moving like normal, you should call the office or go to MAU.  Any other obstetric concerns.  TDaP Vaccine Pregnancy Get the Whooping Cough Vaccine While You Are Pregnant (CDC)  It is important for women to get the whooping cough vaccine in the third trimester of each pregnancy. Vaccines are the best way to prevent this disease. There are 2 different whooping cough vaccines. Both vaccines combine protection against whooping cough, tetanus and diphtheria, but they are for different age groups: Tdap: for everyone 11 years or older, including pregnant women  DTaP: for children 2 months through 6 years of age  You need the whooping cough vaccine during each of your pregnancies The recommended time to get the shot is during your 27th through 36th week of pregnancy, preferably during the earlier part of this time period. The Centers for Disease Control and Prevention (CDC) recommends that pregnant women receive the whooping cough vaccine for adolescents and adults (called Tdap vaccine) during the third trimester of each pregnancy. The recommended time to get the shot is during your 27th through 36th week of pregnancy, preferably during the earlier part of this time period. This replaces the original recommendation that pregnant women get the vaccine only if they had not previously received it. The  American College of Obstetricians and Gynecologists and the American College of Nurse-Midwives support this recommendation.  You should get the whooping cough vaccine while pregnant to pass protection to your baby frame support disabled and/or not supported in this browser  Learn why Laura decided to get the whooping cough vaccine in her 3rd trimester of pregnancy and how her baby girl was born with some protection against the disease. Also available on YouTube. After receiving the whooping cough vaccine, your body will create protective antibodies (proteins produced by the body to fight off diseases) and pass some of them to your baby before birth. These antibodies provide your baby some short-term protection against whooping cough in early life. These antibodies can also protect your baby from some of the more serious complications that come along with whooping cough. Your protective antibodies are at their highest about 2 weeks after getting the vaccine, but it takes time to pass them to your baby. So the preferred time to get the whooping cough vaccine is early in your third trimester. The amount of whooping cough antibodies in your body decreases over time. That is why CDC recommends you get a whooping cough vaccine during each pregnancy. Doing so allows each of your babies to get the greatest number of protective antibodies from you. This means each of your babies will get the best protection possible against this disease.  Getting the whooping cough vaccine while pregnant is better than getting the vaccine after you give birth Whooping cough vaccination during   pregnancy is ideal so your baby will have short-term protection as soon as he is born. This early protection is important because your baby will not start getting his whooping cough vaccines until he is 2 months old. These first few months of life are when your baby is at greatest risk for catching whooping cough. This is also when he's at  greatest risk for having severe, potentially life-threating complications from the infection. To avoid that gap in protection, it is best to get a whooping cough vaccine during pregnancy. You will then pass protection to your baby before he is born. To continue protecting your baby, he should get whooping cough vaccines starting at 2 months old. You may never have gotten the Tdap vaccine before and did not get it during this pregnancy. If so, you should make sure to get the vaccine immediately after you give birth, before leaving the hospital or birthing center. It will take about 2 weeks before your body develops protection (antibodies) in response to the vaccine. Once you have protection from the vaccine, you are less likely to give whooping cough to your newborn while caring for him. But remember, your baby will still be at risk for catching whooping cough from others. A recent study looked to see how effective Tdap was at preventing whooping cough in babies whose mothers got the vaccine while pregnant or in the hospital after giving birth. The study found that getting Tdap between 27 through 36 weeks of pregnancy is 85% more effective at preventing whooping cough in babies younger than 2 months old. Blood tests cannot tell if you need a whooping cough vaccine There are no blood tests that can tell you if you have enough antibodies in your body to protect yourself or your baby against whooping cough. Even if you have been sick with whooping cough in the past or previously received the vaccine, you still should get the vaccine during each pregnancy. Breastfeeding may pass some protective antibodies onto your baby By breastfeeding, you may pass some antibodies you have made in response to the vaccine to your baby. When you get a whooping cough vaccine during your pregnancy, you will have antibodies in your breast milk that you can share with your baby as soon as your milk comes in. However, your baby will not  get protective antibodies immediately if you wait to get the whooping cough vaccine until after delivering your baby. This is because it takes about 2 weeks for your body to create antibodies. Learn more about the health benefits of breastfeeding.  

## 2021-01-26 ENCOUNTER — Other Ambulatory Visit: Payer: 59

## 2021-01-26 ENCOUNTER — Other Ambulatory Visit: Payer: Self-pay

## 2021-01-26 ENCOUNTER — Encounter: Payer: 59 | Admitting: Certified Nurse Midwife

## 2021-01-26 ENCOUNTER — Ambulatory Visit (INDEPENDENT_AMBULATORY_CARE_PROVIDER_SITE_OTHER): Payer: 59 | Admitting: Certified Nurse Midwife

## 2021-01-26 VITALS — BP 104/68 | HR 81 | Wt 178.5 lb

## 2021-01-26 DIAGNOSIS — Z23 Encounter for immunization: Secondary | ICD-10-CM

## 2021-01-26 DIAGNOSIS — R7989 Other specified abnormal findings of blood chemistry: Secondary | ICD-10-CM

## 2021-01-26 DIAGNOSIS — Z3A28 28 weeks gestation of pregnancy: Secondary | ICD-10-CM | POA: Diagnosis not present

## 2021-01-26 DIAGNOSIS — O99283 Endocrine, nutritional and metabolic diseases complicating pregnancy, third trimester: Secondary | ICD-10-CM

## 2021-01-26 DIAGNOSIS — O09519 Supervision of elderly primigravida, unspecified trimester: Secondary | ICD-10-CM

## 2021-01-26 DIAGNOSIS — Z3403 Encounter for supervision of normal first pregnancy, third trimester: Secondary | ICD-10-CM

## 2021-01-26 DIAGNOSIS — E039 Hypothyroidism, unspecified: Secondary | ICD-10-CM

## 2021-01-26 NOTE — Progress Notes (Signed)
   PRENATAL VISIT NOTE  Subjective:  Sarah Estrada is a 40 y.o. G1P0 at [redacted]w[redacted]d being seen today for ongoing prenatal care.  She is currently monitored for the following issues for this low-risk pregnancy and has Supervision of high risk primigravida of advanced maternal age, antepartum; Hypothyroidism affecting pregnancy; Obsessive-compulsive disorder; Elevated LFTs; and Marginal insertion of umbilical cord affecting management of mother on their problem list.  Patient reports no complaints.  Contractions: Not present. Vag. Bleeding: None.  Movement: Present. Denies leaking of fluid.   The following portions of the patient's history were reviewed and updated as appropriate: allergies, current medications, past family history, past medical history, past social history, past surgical history and problem list.   Objective:   Vitals:   01/26/21 0924  BP: 104/68  Pulse: 81  Weight: 178 lb 8 oz (81 kg)    Fetal Status: Fetal Heart Rate (bpm): 161 Fundal Height: 28 cm Movement: Present     General:  Alert, oriented and cooperative. Patient is in no acute distress.  Skin: Skin is warm and dry. No rash noted.   Cardiovascular: Normal heart rate noted  Respiratory: Normal respiratory effort, no problems with respiration noted  Abdomen: Soft, gravid, appropriate for gestational age.  Pain/Pressure: Absent     Pelvic: Cervical exam deferred        Extremities: Normal range of motion.  Edema: None  Mental Status: Normal mood and affect. Normal behavior. Normal judgment and thought content.   Assessment and Plan:  Pregnancy: G1P0 at [redacted]w[redacted]d 1. Supervision of low-risk first pregnancy, third trimester - Doing well, feeling regular and vigorous fetal movement  2. [redacted] weeks gestation of pregnancy - Routine OB care including 28wk testing - Tdap vaccine greater than or equal to 7yo IM  3. Elevated LFTs - No RUQ pain, CMET added to labs today  4. Hypothyroidism affecting pregnancy in third  trimester - Stable on synthroid, TSH added to labs today  Preterm labor symptoms and general obstetric precautions including but not limited to vaginal bleeding, contractions, leaking of fluid and fetal movement were reviewed in detail with the patient. Please refer to After Visit Summary for other counseling recommendations.   Return in about 2 weeks (around 02/09/2021) for IN-PERSON, LOB.  Future Appointments  Date Time Provider [place]  02/09/2021  2:15 PM Helaine Chess [facility]  02/23/2021  2:45 PM [facility] NURSE [facility]  02/23/2021  3:00 PM [facility] US1 [facility] [place]    Gabriel Carina, CNM

## 2021-01-27 LAB — CBC
Hematocrit: 31.9 % — ABNORMAL LOW (ref 34.0–46.6)
Hemoglobin: 10.7 g/dL — ABNORMAL LOW (ref 11.1–15.9)
MCH: 30.1 pg (ref 26.6–33.0)
MCHC: 33.5 g/dL (ref 31.5–35.7)
MCV: 90 fL (ref 79–97)
Platelets: 224 10*3/uL (ref 150–450)
RBC: 3.55 x10E6/uL — ABNORMAL LOW (ref 3.77–5.28)
RDW: 13.4 % (ref 11.7–15.4)
WBC: 10.4 10*3/uL (ref 3.4–10.8)

## 2021-01-27 LAB — COMPREHENSIVE METABOLIC PANEL
ALT: 52 IU/L — ABNORMAL HIGH (ref 0–32)
AST: 32 IU/L (ref 0–40)
Albumin/Globulin Ratio: 1.9 (ref 1.2–2.2)
Albumin: 3.6 g/dL — ABNORMAL LOW (ref 3.8–4.8)
Alkaline Phosphatase: 66 IU/L (ref 44–121)
BUN/Creatinine Ratio: 19 (ref 9–23)
BUN: 10 mg/dL (ref 6–20)
Bilirubin Total: 0.3 mg/dL (ref 0.0–1.2)
CO2: 21 mmol/L (ref 20–29)
Calcium: 8.9 mg/dL (ref 8.7–10.2)
Chloride: 101 mmol/L (ref 96–106)
Creatinine, Ser: 0.54 mg/dL — ABNORMAL LOW (ref 0.57–1.00)
Globulin, Total: 1.9 g/dL (ref 1.5–4.5)
Glucose: 83 mg/dL (ref 65–99)
Potassium: 4.2 mmol/L (ref 3.5–5.2)
Sodium: 134 mmol/L (ref 134–144)
Total Protein: 5.5 g/dL — ABNORMAL LOW (ref 6.0–8.5)
eGFR: 122 mL/min/{1.73_m2} (ref >59)

## 2021-01-27 LAB — RPR: RPR Ser Ql: NONREACTIVE

## 2021-01-27 LAB — GLUCOSE TOLERANCE, 2 HOURS W/ 1HR
Glucose, 1 hour: 152 mg/dL (ref 65–179)
Glucose, 2 hour: 115 mg/dL (ref 65–152)
Glucose, Fasting: 84 mg/dL (ref 65–91)

## 2021-01-27 LAB — TSH: TSH: 2.02 u[IU]/mL (ref 0.450–4.500)

## 2021-01-27 LAB — HIV ANTIBODY (ROUTINE TESTING W REFLEX): HIV Screen 4th Generation wRfx: NONREACTIVE

## 2021-02-09 ENCOUNTER — Ambulatory Visit (INDEPENDENT_AMBULATORY_CARE_PROVIDER_SITE_OTHER): Payer: 59 | Admitting: Certified Nurse Midwife

## 2021-02-09 ENCOUNTER — Other Ambulatory Visit: Payer: Self-pay

## 2021-02-09 VITALS — BP 107/72 | HR 99 | Wt 180.9 lb

## 2021-02-09 DIAGNOSIS — O09519 Supervision of elderly primigravida, unspecified trimester: Secondary | ICD-10-CM

## 2021-02-09 DIAGNOSIS — Z3A30 30 weeks gestation of pregnancy: Secondary | ICD-10-CM

## 2021-02-09 NOTE — Progress Notes (Signed)
   PRENATAL VISIT NOTE  Subjective:  Sarah Estrada is a 40 y.o. G1P0 at [redacted]w[redacted]d being seen today for ongoing prenatal care.  She is currently monitored for the following issues for this low-risk pregnancy and has Supervision of high risk primigravida of advanced maternal age, antepartum; Hypothyroidism affecting pregnancy; Obsessive-compulsive disorder; Elevated LFTs; and Marginal insertion of umbilical cord affecting management of mother on their problem list.  Patient reports no complaints.  Contractions: Not present. Vag. Bleeding: None.  Movement: Present. Denies leaking of fluid.   The following portions of the patient's history were reviewed and updated as appropriate: allergies, current medications, past family history, past medical history, past social history, past surgical history and problem list.   Objective:   Vitals:   02/09/21 1434  BP: 107/72  Pulse: 99  Weight: 180 lb 14.4 oz (82.1 kg)    Fetal Status: Fetal Heart Rate (bpm): 148 Fundal Height: 30 cm Movement: Present     General:  Alert, oriented and cooperative. Patient is in no acute distress.  Skin: Skin is warm and dry. No rash noted.   Cardiovascular: Normal heart rate noted  Respiratory: Normal respiratory effort, no problems with respiration noted  Abdomen: Soft, gravid, appropriate for gestational age.  Pain/Pressure: Absent     Pelvic: Cervical exam deferred        Extremities: Normal range of motion.  Edema: None  Mental Status: Normal mood and affect. Normal behavior. Normal judgment and thought content.   Assessment and Plan:  Pregnancy: G1P0 at [redacted]w[redacted]d 1. Supervision of high risk primigravida of advanced maternal age, antepartum - Doing well, feeling regular and vigorous fetal movement  2. [redacted] weeks gestation of pregnancy - Routine OB care - Reviewed bloodwork from previous appointment: GTT normal, thyroid panel normal, CBC showed mild anemia (10.7), ALT still elevated but less so than previous  level. - Pt feeling a little winded on exertion and gets fatigued in the afternoons. Recommended OTC Blood Builder for gentle iron supplementation and energy levels  Preterm labor symptoms and general obstetric precautions including but not limited to vaginal bleeding, contractions, leaking of fluid and fetal movement were reviewed in detail with the patient. Please refer to After Visit Summary for other counseling recommendations.   Return in about 2 weeks (around 02/23/2021) for IN-PERSON, LOB.  Future Appointments  Date Time Provider [place]  02/23/2021  2:15 PM Donnamae Jude, MD [facility]  02/23/2021  2:45 PM [facility] NURSE [facility]  02/23/2021  3:00 PM [facility] US1 [facility] [place]    Gabriel Carina, CNM

## 2021-02-23 ENCOUNTER — Ambulatory Visit: Payer: 59

## 2021-02-23 ENCOUNTER — Encounter: Payer: Self-pay | Admitting: Family Medicine

## 2021-02-23 ENCOUNTER — Telehealth (INDEPENDENT_AMBULATORY_CARE_PROVIDER_SITE_OTHER): Payer: 59 | Admitting: Family Medicine

## 2021-02-23 VITALS — BP 104/63 | HR 111

## 2021-02-23 DIAGNOSIS — O43199 Other malformation of placenta, unspecified trimester: Secondary | ICD-10-CM

## 2021-02-23 DIAGNOSIS — Z3A32 32 weeks gestation of pregnancy: Secondary | ICD-10-CM

## 2021-02-23 DIAGNOSIS — E039 Hypothyroidism, unspecified: Secondary | ICD-10-CM

## 2021-02-23 DIAGNOSIS — O09513 Supervision of elderly primigravida, third trimester: Secondary | ICD-10-CM

## 2021-02-23 DIAGNOSIS — R7989 Other specified abnormal findings of blood chemistry: Secondary | ICD-10-CM

## 2021-02-23 DIAGNOSIS — O43193 Other malformation of placenta, third trimester: Secondary | ICD-10-CM

## 2021-02-23 DIAGNOSIS — O09519 Supervision of elderly primigravida, unspecified trimester: Secondary | ICD-10-CM

## 2021-02-23 DIAGNOSIS — O99891 Other specified diseases and conditions complicating pregnancy: Secondary | ICD-10-CM

## 2021-02-23 DIAGNOSIS — O99283 Endocrine, nutritional and metabolic diseases complicating pregnancy, third trimester: Secondary | ICD-10-CM

## 2021-02-23 NOTE — Patient Instructions (Signed)
[email or web address]">  Third Trimester of Pregnancy  The third trimester of pregnancy is from week 28 through week 40. This is months 7 through 9. The third trimester is a time when the unborn baby (fetus) is growing rapidly. At the end of the ninth month, the fetus is about 20inches long and weighs 6-10 pounds. Body changes during your third trimester During the third trimester, your body will continue to go through many changes.The changes vary and generally return to normal after your baby is born. Physical changes Your weight will continue to increase. You can expect to gain 25-35 pounds (11-16 kg) by the end of the pregnancy if you begin pregnancy at a normal weight. If you are underweight, you can expect to gain 28-40 lb (about 13-18 kg), and if you are overweight, you can expect to gain 15-25 lb (about 7-11 kg). You may begin to get stretch marks on your hips, abdomen, and breasts. Your breasts will continue to grow and may hurt. A yellow fluid (colostrum) may leak from your breasts. This is the first milk you are producing for your baby. You may have changes in your hair. These can include thickening of your hair, rapid growth, and changes in texture. Some people also have hair loss during or after pregnancy, or hair that feels dry or thin. Your belly button may stick out. You may notice more swelling in your hands, face, or ankles. Health changes You may have heartburn. You may have constipation. You may develop hemorrhoids. You may develop swollen, bulging veins (varicose veins) in your legs. You may have increased body aches in the pelvis, back, or thighs. This is due to weight gain and increased hormones that are relaxing your joints. You may have increased tingling or numbness in your hands, arms, and legs. The skin on your abdomen may also feel numb. You may feel short of breath because of your expanding uterus. Other  changes You may urinate more often because the fetus is moving lower into your pelvis and pressing on your bladder. You may have more problems sleeping. This may be caused by the size of your abdomen, an increased need to urinate, and an increase in your body's metabolism. You may notice the fetus "dropping," or moving lower in your abdomen (lightening). You may have increased vaginal discharge. You may notice that you have pain around your pelvic bone as your uterus distends. Follow these instructions at home: Medicines Follow your health care provider's instructions regarding medicine use. Specific medicines may be either safe or unsafe to take during pregnancy. Do not take any medicines unless approved by your health care provider. Take a prenatal vitamin that contains at least 600 micrograms (mcg) of folic acid. Eating and drinking Eat a healthy diet that includes fresh fruits and vegetables, whole grains, good sources of protein such as meat, eggs, or tofu, and low-fat dairy products. Avoid raw meat and unpasteurized juice, milk, and cheese. These carry germs that can harm you and your baby. Eat 4 or 5 small meals rather than 3 large meals a day. You may need to take these actions to prevent or treat constipation: Drink enough fluid to keep your urine pale yellow. Eat foods that are high in fiber, such as beans, whole grains, and fresh fruits and vegetables. Limit foods that are high in fat and processed sugars, such as fried or sweet foods. Activity Exercise only as directed by your health care provider. Most people can continue their usual exercise routine during pregnancy. Try  to exercise for 30 minutes at least 5 days a week. Stop exercising if you experience contractions in the uterus. Stop exercising if you develop pain or cramping in the lower abdomen or lower back. Avoid heavy lifting. Do not exercise if it is very hot or humid or if you are at a high altitude. If you choose to,  you may continue to have sex unless your health care provider tells you not to. Relieving pain and discomfort Take frequent breaks and rest with your legs raised (elevated) if you have leg cramps or low back pain. Take warm sitz baths to soothe any pain or discomfort caused by hemorrhoids. Use hemorrhoid cream if your health care provider approves. Wear a supportive bra to prevent discomfort from breast tenderness. If you develop varicose veins: Wear support hose as told by your health care provider. Elevate your feet for 15 minutes, 3-4 times a day. Limit salt in your diet. Safety Talk to your health care provider before traveling far distances. Do not use hot tubs, steam rooms, or saunas. Wear your seat belt at all times when driving or riding in a car. Talk with your health care provider if someone is verbally or physically abusive to you. Preparing for birth To prepare for the arrival of your baby: Take prenatal classes to understand, practice, and ask questions about labor and delivery. Visit the hospital and tour the maternity area. Purchase a rear-facing car seat and make sure you know how to install it in your car. Prepare the baby's room or sleeping area. Make sure to remove all pillows and stuffed animals from the baby's crib to prevent suffocation. General instructions Avoid cat litter boxes and soil used by cats. These carry germs that can cause birth defects in the baby. If you have a cat, ask someone to clean the litter box for you. Do not douche or use tampons. Do not use scented sanitary pads. Do not use any products that contain nicotine or tobacco, such as cigarettes, e-cigarettes, and chewing tobacco. If you need help quitting, ask your health care provider. Do not use any herbal remedies, illegal drugs, or medicines that were not prescribed to you. Chemicals in these products can harm your baby. Do not drink alcohol. You will have more frequent prenatal exams during the  third trimester. During a routine prenatal visit, your health care provider will do a physical exam, perform tests, and discuss your overall health. Keep all follow-up visits. This is important. Where to find more information American Pregnancy Association: americanpregnancy.[place] and Gynecologists: PoolDevices.com.pt Office on [employer]: KeywordPortfolios.com.br Contact a health care provider if you have: A fever. Mild pelvic cramps, pelvic pressure, or nagging pain in your abdominal area or lower back. Vomiting or diarrhea. Bad-smelling vaginal discharge or foul-smelling urine. Pain when you urinate. A headache that does not go away when you take medicine. Visual changes or see spots in front of your eyes. Get help right away if: Your water breaks. You have regular contractions less than 5 minutes apart. You have spotting or bleeding from your vagina. You have severe abdominal pain. You have difficulty breathing. You have chest pain. You have fainting spells. You have not felt your baby move for the time period told by your health care provider. You have new or increased pain, swelling, or redness in an arm or leg. Summary The third trimester of pregnancy is from week 28 through week 40 (months 7 through 9). You may have more problems  sleeping. This can be caused by the size of your abdomen, an increased need to urinate, and an increase in your body's metabolism. You will have more frequent prenatal exams during the third trimester. Keep all follow-up visits. This is important. This information is not intended to replace advice given to you by your health care provider. Make sure you discuss any questions you have with your healthcare provider. Document Revised: 01/21/2020 Document Reviewed: 11/27/2019 Elsevier Patient Education  2022 [employer].

## 2021-02-23 NOTE — Progress Notes (Signed)
I connected with  Sarah Estrada on 02/23/21 at  2:15 PM EDT by telephone and verified that I am speaking with the correct person using two identifiers.   I discussed the limitations, risks, security and privacy concerns of performing an evaluation and management service by telephone and the availability of in person appointments. I also discussed with the patient that there may be a patient responsible charge related to this service. The patient expressed understanding and agreed to proceed.  Sarah Lopes, RN 02/23/2021  2:22 PM  Pt at home  RN at [facility]  Time spent with pt 5 mins.

## 2021-02-23 NOTE — Progress Notes (Signed)
    OBSTETRICS PRENATAL VIRTUAL VISIT ENCOUNTER NOTE  Provider location: [facility] [place] [facility] [place] [facility]   Patient location: Home  I connected with Sarah Estrada on 02/23/21 at  2:15 PM EDT by MyChart Video Encounter and verified that I am speaking with the correct person using two identifiers. I discussed the limitations, risks, security and privacy concerns of performing an evaluation and management service virtually and the availability of in person appointments. I also discussed with the patient that there may be a patient responsible charge related to this service. The patient expressed understanding and agreed to proceed. Subjective:  Sarah Estrada is a 40 y.o. G1P0 at [redacted]w[redacted]d being seen today for ongoing prenatal care.  She is currently monitored for the following issues for this high-risk pregnancy and has Supervision of high risk primigravida of advanced maternal age, antepartum; Hypothyroidism affecting pregnancy; Obsessive-compulsive disorder; Elevated LFTs; and Marginal insertion of umbilical cord affecting management of mother on their problem list.  Patient reports  diagnosed with COVID recently .Reports congestion mostly. Also feels achy, no temps today.  Contractions: Not present. Vag. Bleeding: None.  Movement: Present. Denies any leaking of fluid.   The following portions of the patient's history were reviewed and updated as appropriate: allergies, current medications, past family history, past medical history, past social history, past surgical history and problem list.   Objective:   Vitals:   02/23/21 1423  BP: 104/63  Pulse: (!) 111    Fetal Status:     Movement: Present     General:  Alert, oriented and cooperative. Patient is in no acute distress.  Respiratory: Normal respiratory effort, no problems with respiration noted  Mental Status: Normal mood and affect. Normal behavior. Normal judgment and thought content.  Rest of physical  exam deferred due to type of encounter  Imaging: No results found.  Assessment and Plan:  Pregnancy: G1P0 at [redacted]w[redacted]d 1. Encounter for supervision of high risk primigravida of advanced maternal age, antepartum LR NIPT  2. Elevated LFTs Improving on last check  3. Marginal insertion of umbilical cord affecting management of mother Has serial u/s for growth, next due 7/8  4. Hypothyroidism affecting pregnancy in third trimester Last TSH normal  5. COVID affecting pregnancy OTC, symptomatic treatment Warning signs reviewed.  Preterm labor symptoms and general obstetric precautions including but not limited to vaginal bleeding, contractions, leaking of fluid and fetal movement were reviewed in detail with the patient. I discussed the assessment and treatment plan with the patient. The patient was provided an opportunity to ask questions and all were answered. The patient agreed with the plan and demonstrated an understanding of the instructions. The patient was advised to call back or seek an in-person office evaluation/go to MAU at [facility] for any urgent or concerning symptoms. Please refer to After Visit Summary for other counseling recommendations.   I provided 9 minutes of face-to-face time during this encounter.  Return in 2 weeks (on 03/09/2021).  Future Appointments  Date Time Provider [place]  03/04/2021 12:30 PM [facility] NURSE [facility]  03/04/2021 12:45 PM [facility] US5 [facility] [place], MD [facility] for [place], [place]

## 2021-03-04 ENCOUNTER — Other Ambulatory Visit: Payer: Self-pay | Admitting: *Deleted

## 2021-03-04 ENCOUNTER — Other Ambulatory Visit: Payer: Self-pay

## 2021-03-04 ENCOUNTER — Ambulatory Visit: Payer: 59 | Attending: Obstetrics and Gynecology | Admitting: *Deleted

## 2021-03-04 ENCOUNTER — Ambulatory Visit (HOSPITAL_BASED_OUTPATIENT_CLINIC_OR_DEPARTMENT_OTHER): Payer: 59

## 2021-03-04 ENCOUNTER — Encounter: Payer: Self-pay | Admitting: *Deleted

## 2021-03-04 VITALS — BP 108/61 | HR 73

## 2021-03-04 DIAGNOSIS — Z3A33 33 weeks gestation of pregnancy: Secondary | ICD-10-CM | POA: Diagnosis not present

## 2021-03-04 DIAGNOSIS — O99283 Endocrine, nutritional and metabolic diseases complicating pregnancy, third trimester: Secondary | ICD-10-CM | POA: Diagnosis not present

## 2021-03-04 DIAGNOSIS — O09529 Supervision of elderly multigravida, unspecified trimester: Secondary | ICD-10-CM

## 2021-03-04 DIAGNOSIS — E039 Hypothyroidism, unspecified: Secondary | ICD-10-CM

## 2021-03-04 DIAGNOSIS — O09519 Supervision of elderly primigravida, unspecified trimester: Secondary | ICD-10-CM

## 2021-03-04 DIAGNOSIS — O09523 Supervision of elderly multigravida, third trimester: Secondary | ICD-10-CM | POA: Insufficient documentation

## 2021-03-04 DIAGNOSIS — O43193 Other malformation of placenta, third trimester: Secondary | ICD-10-CM | POA: Insufficient documentation

## 2021-03-04 DIAGNOSIS — R7989 Other specified abnormal findings of blood chemistry: Secondary | ICD-10-CM

## 2021-03-04 DIAGNOSIS — O43199 Other malformation of placenta, unspecified trimester: Secondary | ICD-10-CM

## 2021-03-07 ENCOUNTER — Other Ambulatory Visit (HOSPITAL_COMMUNITY): Payer: Self-pay

## 2021-03-08 ENCOUNTER — Other Ambulatory Visit (HOSPITAL_COMMUNITY): Payer: Self-pay

## 2021-03-09 ENCOUNTER — Other Ambulatory Visit (HOSPITAL_COMMUNITY): Payer: Self-pay

## 2021-03-09 MED ORDER — LEVOTHYROXINE SODIUM 75 MCG PO TABS
ORAL_TABLET | ORAL | 3 refills | Status: DC
  Filled 2021-03-09: qty 90, 90d supply, fill #0
  Filled 2021-06-15: qty 90, 90d supply, fill #1
  Filled 2021-09-09: qty 90, 90d supply, fill #2
  Filled 2021-12-15: qty 90, 90d supply, fill #3

## 2021-03-10 ENCOUNTER — Telehealth: Payer: Self-pay

## 2021-03-10 NOTE — Telephone Encounter (Signed)
Pt called wanting to know about her FMLA paperwork.  Reviewed pt's chart and saw that Gay Filler had stated that her paperwork could be picked up at the front desk when she came in to sign ROI.  Paperwork not at front desk.  I advised pt that I would send a message to Gay Filler however she is out of town so to look for her to return her call next week.  Pt verbalized understanding.   Message routed to [facility] for advisement.    Mel Almond, RN

## 2021-03-16 ENCOUNTER — Telehealth (INDEPENDENT_AMBULATORY_CARE_PROVIDER_SITE_OTHER): Payer: 59 | Admitting: Obstetrics and Gynecology

## 2021-03-16 VITALS — BP 112/64 | HR 92 | Wt 184.0 lb

## 2021-03-16 DIAGNOSIS — O09519 Supervision of elderly primigravida, unspecified trimester: Secondary | ICD-10-CM

## 2021-03-16 DIAGNOSIS — O43199 Other malformation of placenta, unspecified trimester: Secondary | ICD-10-CM

## 2021-03-16 DIAGNOSIS — Z3A35 35 weeks gestation of pregnancy: Secondary | ICD-10-CM | POA: Insufficient documentation

## 2021-03-16 DIAGNOSIS — O99283 Endocrine, nutritional and metabolic diseases complicating pregnancy, third trimester: Secondary | ICD-10-CM

## 2021-03-16 DIAGNOSIS — R7989 Other specified abnormal findings of blood chemistry: Secondary | ICD-10-CM

## 2021-03-16 DIAGNOSIS — O43193 Other malformation of placenta, third trimester: Secondary | ICD-10-CM

## 2021-03-16 DIAGNOSIS — O99891 Other specified diseases and conditions complicating pregnancy: Secondary | ICD-10-CM

## 2021-03-16 DIAGNOSIS — O09513 Supervision of elderly primigravida, third trimester: Secondary | ICD-10-CM

## 2021-03-16 DIAGNOSIS — E039 Hypothyroidism, unspecified: Secondary | ICD-10-CM

## 2021-03-16 NOTE — Progress Notes (Signed)
OBSTETRICS PRENATAL VIRTUAL VISIT ENCOUNTER NOTE  Provider location: [facility] [place] [facility] [place] [facility]   Patient location: Home  I connected with Sarah Estrada on 03/16/21 at  3:55 PM EDT by MyChart Video Encounter and verified that I am speaking with the correct person using two identifiers. I discussed the limitations, risks, security and privacy concerns of performing an evaluation and management service virtually and the availability of in person appointments. I also discussed with the patient that there may be a patient responsible charge related to this service. The patient expressed understanding and agreed to proceed. Subjective:  Sarah Estrada is a 40 y.o. G1P0 at [redacted]w[redacted]d being seen today for ongoing prenatal care.  She is currently monitored for the following issues for this low-risk pregnancy and has Supervision of high risk primigravida of advanced maternal age, antepartum; Hypothyroidism affecting pregnancy; Obsessive-compulsive disorder; Elevated LFTs; Marginal insertion of umbilical cord affecting management of mother; and [redacted] weeks gestation of pregnancy on their problem list.  Patient reports no complaints.  Contractions: Not present. Vag. Bleeding: Scant.  Movement: Present. Denies any leaking of fluid.   The following portions of the patient's history were reviewed and updated as appropriate: allergies, current medications, past family history, past medical history, past social history, past surgical history and problem list.   Objective:   Vitals:   03/16/21 1603  BP: 112/64  Pulse: 92  Weight: 184 lb (83.5 kg)    Fetal Status:     Movement: Present     General:  Alert, oriented and cooperative. Patient is in no acute distress.  Respiratory: Normal respiratory effort, no problems with respiration noted  Mental Status: Normal mood and affect. Normal behavior. Normal judgment and thought content.  Rest of physical exam deferred due to type  of encounter  Imaging: [place] MFM OB FOLLOW UP  Result Date: 03/04/2021 ----------------------------------------------------------------------  OBSTETRICS REPORT                       (Signed Final 03/04/2021 03:35 pm) ---------------------------------------------------------------------- Patient Info  ID #:       [id number]                          D.O.B.:  1981-07-03 (39 yrs)  Name:       Sarah Estrada              Visit Date: 03/04/2021 12:28 pm ---------------------------------------------------------------------- Performed By  Attending:        Tama High MD        Ref. Address:     [place], [place]  [address]  Performed By:     Lelan Pons RDMS       [facility]                                                             [place] [facility]                                                             [facility]  Referred By:      Osborne Oman MD ---------------------------------------------------------------------- Orders  #  Description                           Code        Ordered By  1  [place] MFM OB FOLLOW UP                   95284.13    Peterson Ao ----------------------------------------------------------------------  #  Order #                     Accession #                Episode #  1  [id number]                   [id number]                 [id number] ---------------------------------------------------------------------- Indications  [redacted] weeks gestation of pregnancy                Z3A.35  Advanced maternal age multigravida 58+,        O79.523  third trimester  Marginal insertion of umbilical cord affecting O43.193  management of mother in third trimester  Hypothyroid - synthroid                         O99.280 E03.9  LR NIPS, Neg AFP ---------------------------------------------------------------------- Fetal Evaluation  Num Of Fetuses:         1  Fetal Heart Rate(bpm):  147  Cardiac Activity:       Observed  Presentation:           Cephalic  Placenta:               Posterior  P. Cord Insertion:      Visualized, central  Amniotic Fluid  AFI FV:      Within normal limits  AFI Sum(cm)     %Tile       Largest Pocket(cm)  20.4            76          7.1  RUQ(cm)       RLQ(cm)       LUQ(cm)        LLQ(cm)  7.1           2.9           5.7            4.8 ---------------------------------------------------------------------- Biometry  BPD:      90.4  mm     G. Age:  36w 4d         99  %    CI:        75.95   %    70 - 86                                                          FL/HC:      20.5   %    19.4 - 21.8  HC:      328.8  mm     G. Age:  37w 3d         95  %    HC/AC:      1.08        0.96 - 1.11  AC:      304.8  mm     G. Age:  34w 3d         87  %    FL/BPD:     74.7   %    71 - 87  FL:       67.5  mm     G. Age:  34w 5d         70  %    FL/AC:      22.1   %    20 - 24  Est. FW:    2575  gm    5 lb 11 oz      84  % ---------------------------------------------------------------------- OB History  Gravidity:    1 ---------------------------------------------------------------------- Gestational Age  LMP:           33w 4d        Date:  07/12/20                 EDD:   04/18/21  U/S Today:     35w 6d                                        EDD:   04/02/21  Best:          33w 4d     Det. By:  LMP  (07/12/20)          EDD:   04/18/21 ---------------------------------------------------------------------- Anatomy  Cranium:               Appears normal         Aortic Arch:            Previously seen  Cavum:                 Previously seen        Ductal Arch:  Previously seen  Ventricles:            Appears normal         Diaphragm:              Previously seen  Choroid Plexus:        Previously seen         Stomach:                Appears normal, left                                                                        sided  Cerebellum:            Previously seen        Abdomen:                Previously seen  Posterior Fossa:       Previously seen        Abdominal Wall:         Previously seen  Nuchal Fold:           Previously seen        Cord Vessels:           Previously seen  Face:                  Orbits and profile     Kidneys:                Appear normal                         previously seen  Lips:                  Previously seen        Bladder:                Appears normal  Thoracic:              Previously seen        Spine:                  Previously seen  Heart:                 Appears normal         Upper Extremities:      Previously seen                         (4CH, axis, and                         situs)  RVOT:                  Previously seen        Lower Extremities:      Previously seen  LVOT:                  Previously seen  Other:  Female gender previously seen. VC, 3VV and 3VTV previously          visualized. Heels and 5th digit previously visualized. ---------------------------------------------------------------------- Cervix Uterus Adnexa  Cervix  Not visualized (advanced GA >24wks)  Right Ovary  Visualized.  Left Ovary  Visualized. ---------------------------------------------------------------------- Impression  Marginal cord seen on previous ultrasound.  Amniotic fluid is normal and good fetal activity is seen .Fetal  growth is appropriate for gestational age head circumference  measurement is above the 90th percentile.  Intracranial  structures appear normal..  Placental cord insertion appears  normal.  Maternal hypothyroidism.  Most recent TSH level is within  normal range (euthyroid). ---------------------------------------------------------------------- Recommendations  -An appointment was made for her to return in 4 weeks for  fetal growth assessment. -An appointment was made  for her  to return in 4 weeks for fetal growth assessment. ----------------------------------------------------------------------                  Tama High, MD Electronically Signed Final Report   03/04/2021 03:35 pm ----------------------------------------------------------------------   Assessment and Plan:  Pregnancy: G1P0 at [redacted]w[redacted]d 1. Encounter for supervision of high risk primigravida of advanced maternal age, antepartum Continue routine prenatal care, 36 week labs next visit  2. Elevated LFTs Improved labs on 01/26/21, normal RUQ u/s  3. [redacted] weeks gestation of pregnancy   4. Hypothyroidism affecting pregnancy in third trimester TSH normal  5. Marginal insertion of umbilical cord affecting management of mother Evaluated 7/8 with MFM u/s, repeat growth scan 04/01/21  Preterm labor symptoms and general obstetric precautions including but not limited to vaginal bleeding, contractions, leaking of fluid and fetal movement were reviewed in detail with the patient. I discussed the assessment and treatment plan with the patient. The patient was provided an opportunity to ask questions and all were answered. The patient agreed with the plan and demonstrated an understanding of the instructions. The patient was advised to call back or seek an in-person office evaluation/go to MAU at [facility] for any urgent or concerning symptoms. Please refer to After Visit Summary for other counseling recommendations.   I provided 10 minutes of face-to-face time during this encounter.  Return in about 1 week (around 03/23/2021) for LOB, in person, 36 weeks swabs.  Future Appointments  Date Time Provider [place]  04/01/2021 12:45 PM [facility] NURSE [facility]  04/01/2021  1:00 PM [facility] US1 [facility] [place], MD Center for [employer], [place]

## 2021-03-16 NOTE — Progress Notes (Signed)
I connected with  Sarah Estrada on 03/16/21 at  3:55 PM EDT by MyChart Virtual Video Visit and verified that I am speaking with the correct person using two identifiers.   I discussed the limitations, risks, security and privacy concerns of performing an evaluation and management service by telephone and the availability of in person appointments. I also discussed with the patient that there may be a patient responsible charge related to this service. The patient expressed understanding and agreed to proceed.  Mena Goes, CMA 03/16/2021  3:59 PM

## 2021-03-18 ENCOUNTER — Encounter: Payer: Self-pay | Admitting: *Deleted

## 2021-03-23 ENCOUNTER — Telehealth: Payer: Self-pay | Admitting: *Deleted

## 2021-03-23 NOTE — Telephone Encounter (Signed)
Call to patient regarding FMLA forms.  Needs to sign release of information before forms can be faxed.  Left message to call back to [phone].

## 2021-03-23 NOTE — Telephone Encounter (Signed)
See My Chart message from patient. Encounter closed.

## 2021-03-28 ENCOUNTER — Other Ambulatory Visit: Payer: Self-pay

## 2021-03-28 ENCOUNTER — Ambulatory Visit (INDEPENDENT_AMBULATORY_CARE_PROVIDER_SITE_OTHER): Payer: 59 | Admitting: Family Medicine

## 2021-03-28 ENCOUNTER — Other Ambulatory Visit (HOSPITAL_COMMUNITY)
Admission: RE | Admit: 2021-03-28 | Discharge: 2021-03-28 | Disposition: A | Discharge status: Home / Self Care | Payer: 59 | Source: Ambulatory Visit | Attending: Family Medicine | Admitting: Family Medicine

## 2021-03-28 ENCOUNTER — Encounter: Payer: Self-pay | Admitting: Family Medicine

## 2021-03-28 VITALS — BP 109/69 | HR 90 | Wt 182.7 lb

## 2021-03-28 DIAGNOSIS — O09519 Supervision of elderly primigravida, unspecified trimester: Secondary | ICD-10-CM | POA: Insufficient documentation

## 2021-03-28 DIAGNOSIS — E039 Hypothyroidism, unspecified: Secondary | ICD-10-CM

## 2021-03-28 DIAGNOSIS — O43199 Other malformation of placenta, unspecified trimester: Secondary | ICD-10-CM

## 2021-03-28 DIAGNOSIS — O99283 Endocrine, nutritional and metabolic diseases complicating pregnancy, third trimester: Secondary | ICD-10-CM

## 2021-03-28 DIAGNOSIS — Z3A37 37 weeks gestation of pregnancy: Secondary | ICD-10-CM

## 2021-03-28 DIAGNOSIS — F429 Obsessive-compulsive disorder, unspecified: Secondary | ICD-10-CM

## 2021-03-28 NOTE — Progress Notes (Signed)
    Subjective:  Sarah Estrada is a 40 y.o. G1P0 at 11w0dbeing seen today for ongoing prenatal care.  She is currently monitored for the following issues for this high-risk pregnancy and has Supervision of high risk primigravida of advanced maternal age, antepartum; Hypothyroidism affecting pregnancy; Obsessive-compulsive disorder; Elevated LFTs; and Marginal insertion of umbilical cord affecting management of mother on their problem list.  Patient reports no complaints.  Contractions: Not present. Vag. Bleeding: None.  Movement: Present. Denies leaking of fluid.   The following portions of the patient's history were reviewed and updated as appropriate: allergies, current medications, past family history, past medical history, past social history, past surgical history and problem list.   Objective:   Vitals:   03/28/21 0915  BP: 109/69  Pulse: 90  Weight: 182 lb 11.2 oz (82.9 kg)    Fetal Status: Fetal Heart Rate (bpm): 137 Fundal Height: 37 cm Movement: Present     General:  Alert, oriented and cooperative. Patient is in no acute distress.  Skin: Skin is warm and dry. No rash noted.   Cardiovascular: Normal heart rate noted  Respiratory: Normal respiratory effort, no problems with respiration noted  Abdomen: Soft, gravid, appropriate for gestational age. Pain/Pressure: Absent     Pelvic:  Cervical exam deferred   External genitalia normal female. Cephalic position via Leopold's and bedside U/S      Extremities: Normal range of motion.  Edema: Trace  Mental Status: Normal mood and affect. Normal behavior. Normal judgment and thought content.    Assessment and Plan:  Pregnancy: G1P0 at 352w0d1. Supervision of high risk primigravida of advanced maternal age, antepartum No complaints, doing well.  - Culture, beta strep (group b only) - [facility]. Marginal insertion of umbilical cord affecting management of mother F/u MFM U/S  04/01/2021  3. Hypothyroidism affecting pregnancy in third trimester TSH WNL in 01/2021, taking Synthroid as prescribed.   4. Obsessive-compulsive disorder, unspecified type On Zoloft '100mg'$  daily, continue as is.   5. [redacted] weeks gestation of pregnancy   Term labor symptoms and general obstetric precautions including but not limited to vaginal bleeding, contractions, leaking of fluid and fetal movement were reviewed in detail with the patient. Please refer to After Visit Summary for other counseling recommendations.  Return in about 1 week (around 04/04/2021) for LROB.   BePatriciaann ClanDO

## 2021-03-28 NOTE — Patient Instructions (Addendum)
If you have any leakage of fluid, vaginal bleeding, persistent and frequent painful contractions, or decreased fetal movement>>please go to the MAU.   [facility]  [address], [place]  Entrance C

## 2021-03-29 LAB — GC/CHLAMYDIA PROBE AMP (~~LOC~~) NOT AT ARMC
Chlamydia: NEGATIVE
Comment: NEGATIVE
Comment: NORMAL
Neisseria Gonorrhea: NEGATIVE

## 2021-04-01 ENCOUNTER — Other Ambulatory Visit: Payer: Self-pay

## 2021-04-01 ENCOUNTER — Other Ambulatory Visit: Payer: Self-pay | Admitting: Obstetrics and Gynecology

## 2021-04-01 ENCOUNTER — Ambulatory Visit: Payer: 59 | Admitting: *Deleted

## 2021-04-01 ENCOUNTER — Ambulatory Visit: Payer: 59 | Attending: Obstetrics and Gynecology

## 2021-04-01 ENCOUNTER — Encounter: Payer: Self-pay | Admitting: *Deleted

## 2021-04-01 VITALS — BP 117/60 | HR 64

## 2021-04-01 DIAGNOSIS — R7989 Other specified abnormal findings of blood chemistry: Secondary | ICD-10-CM | POA: Insufficient documentation

## 2021-04-01 DIAGNOSIS — Z3A37 37 weeks gestation of pregnancy: Secondary | ICD-10-CM | POA: Diagnosis not present

## 2021-04-01 DIAGNOSIS — O43199 Other malformation of placenta, unspecified trimester: Secondary | ICD-10-CM

## 2021-04-01 DIAGNOSIS — E039 Hypothyroidism, unspecified: Secondary | ICD-10-CM

## 2021-04-01 DIAGNOSIS — O09519 Supervision of elderly primigravida, unspecified trimester: Secondary | ICD-10-CM | POA: Diagnosis not present

## 2021-04-01 DIAGNOSIS — O99283 Endocrine, nutritional and metabolic diseases complicating pregnancy, third trimester: Secondary | ICD-10-CM

## 2021-04-01 DIAGNOSIS — O358XX0 Maternal care for other (suspected) fetal abnormality and damage, not applicable or unspecified: Secondary | ICD-10-CM | POA: Insufficient documentation

## 2021-04-01 DIAGNOSIS — O43193 Other malformation of placenta, third trimester: Secondary | ICD-10-CM | POA: Diagnosis not present

## 2021-04-01 DIAGNOSIS — O35BXX0 Maternal care for other (suspected) fetal abnormality and damage, fetal cardiac anomalies, not applicable or unspecified: Secondary | ICD-10-CM

## 2021-04-01 DIAGNOSIS — O09523 Supervision of elderly multigravida, third trimester: Secondary | ICD-10-CM

## 2021-04-01 LAB — CULTURE, BETA STREP (GROUP B ONLY): Strep Gp B Culture: NEGATIVE

## 2021-04-01 NOTE — Procedures (Signed)
Sarah Estrada 11-29-1980 [redacted]w[redacted]d Fetus A Non-Stress Test Interpretation for 04/01/21  Indication:  fetal cardiomegaly  Fetal Heart Rate A Mode: External Baseline Rate (A): 145 bpm Variability: Moderate Accelerations: 15 x 15 Decelerations: None Multiple birth?: No  Uterine Activity Mode: Palpation, Toco Contraction Frequency (min): 3 uc's Contraction Duration (sec): 60-80 Contraction Quality: Mild Resting Tone Palpated: Relaxed Resting Time: Adequate  Interpretation (Fetal Testing) Nonstress Test Interpretation: Reactive Overall Impression: Reassuring for gestational age Comments: Dr. SDonalee Citrinreviewed tracing.

## 2021-04-04 ENCOUNTER — Other Ambulatory Visit: Payer: Self-pay

## 2021-04-04 ENCOUNTER — Ambulatory Visit (INDEPENDENT_AMBULATORY_CARE_PROVIDER_SITE_OTHER): Payer: 59 | Admitting: Obstetrics and Gynecology

## 2021-04-04 VITALS — BP 116/69 | HR 75 | Wt 183.0 lb

## 2021-04-04 DIAGNOSIS — Z3A38 38 weeks gestation of pregnancy: Secondary | ICD-10-CM

## 2021-04-04 DIAGNOSIS — O283 Abnormal ultrasonic finding on antenatal screening of mother: Secondary | ICD-10-CM

## 2021-04-04 DIAGNOSIS — O09519 Supervision of elderly primigravida, unspecified trimester: Secondary | ICD-10-CM

## 2021-04-04 DIAGNOSIS — E039 Hypothyroidism, unspecified: Secondary | ICD-10-CM

## 2021-04-04 DIAGNOSIS — O43199 Other malformation of placenta, unspecified trimester: Secondary | ICD-10-CM

## 2021-04-04 DIAGNOSIS — O99283 Endocrine, nutritional and metabolic diseases complicating pregnancy, third trimester: Secondary | ICD-10-CM

## 2021-04-04 NOTE — Progress Notes (Signed)
PRENATAL VISIT NOTE  Subjective:  Sarah Estrada is a 40 y.o. G1P0 at 56w0dbeing seen today for ongoing prenatal care.  She is currently monitored for the following issues for this high-risk pregnancy and has Supervision of high risk primigravida of advanced maternal age, antepartum; Hypothyroidism affecting pregnancy; Obsessive-compulsive disorder; Elevated LFTs; Marginal insertion of umbilical cord affecting management of mother; and [redacted] weeks gestation of pregnancy on their problem list.  Patient doing well with no acute concerns today. She reports no complaints.  Contractions: Irritability. Vag. Bleeding: Scant.  Movement: Present. Denies leaking of fluid.  Per MFM eval pt needs delivery at around 38 weeks due to fetal cardiac issues.  The following portions of the patient's history were reviewed and updated as appropriate: allergies, current medications, past family history, past medical history, past social history, past surgical history and problem list. Problem list updated.  Objective:   Vitals:   04/04/21 0954  BP: 116/69  Pulse: 75  Weight: 183 lb (83 kg)    Fetal Status: Fetal Heart Rate (bpm): 137   Movement: Present     General:  Alert, oriented and cooperative. Patient is in no acute distress.  Skin: Skin is warm and dry. No rash noted.   Cardiovascular: Normal heart rate noted  Respiratory: Normal respiratory effort, no problems with respiration noted  Abdomen: Soft, gravid, appropriate for gestational age.  Pain/Pressure: Absent     Pelvic: Cervical exam deferred        Extremities: Normal range of motion.  Edema: Trace  Mental Status:  Normal mood and affect. Normal behavior. Normal judgment and thought content.  Indications  Advanced maternal age multigravida 333+        OO41.523 third trimester  Marginal insertion of umbilical cord affecting O43.193  management of mother in third trimester  Hypothyroid - synthroid                        O99.280 E03.9  [redacted]  weeks gestation of pregnancy                Z3A.37 ---------------------------------------------------------------------- Fetal Evaluation  Num Of Fetuses:         1  Cardiac Activity:       Observed  Presentation:           Cephalic  Amniotic Fluid  AFI FV:      Within normal limits  AFI Sum(cm)     %Tile       Largest Pocket(cm)  18.91           73          5.91  RUQ(cm)       RLQ(cm)       LUQ(cm)        LLQ(cm)  4.58          3.6           5.91           4.82 ---------------------------------------------------------------------- Biophysical Evaluation  Amniotic F.V:   Pocket => 2 cm             F. Tone:        Observed  F. Movement:    Observed                   N.S.T:          Reactive  F. Breathing:   Observed  Score:          10/10 ---------------------------------------------------------------------- Biometry  BPD:      95.2  mm     G. Age:  38w 6d         92  %    CI:        80.64   %    70 - 86                                                          FL/HC:      21.7   %    20.9 - 22.7  HC:      334.8  mm     G. Age:  38w 2d         45  %    HC/AC:      0.89        0.92 - 1.05  AC:       375   mm     G. Age:  41w 3d       > 99  %    FL/BPD:     76.3   %    71 - 87  FL:       72.6  mm     G. Age:  37w 1d         40  %    FL/AC:      19.4   %    20 - 24  Est. FW:    3926  gm    8 lb 10 oz      97  % ---------------------------------------------------------------------- OB History  Gravidity:    1 ---------------------------------------------------------------------- Gestational Age  LMP:           37w 4d        Date:  07/12/20                 EDD:   04/18/21  U/S Today:     39w 0d                                        EDD:   04/08/21  Best:          37w 4d     Det. By:  LMP  (07/12/20)          EDD:   04/18/21 ---------------------------------------------------------------------- Anatomy  Cranium:               Appears normal         Aortic Arch:             Not well visualized  Cavum:                 Appears normal         Ductal Arch:            Tortuous  Ventricles:            Appears normal         Stomach:                Appears normal, left  sided  Heart:                 Abnormal, see          Kidneys:                Appear normal                         comments  RVOT:                  Measures Large         Bladder:                Appears normal  LVOT:                  Appears normal ---------------------------------------------------------------------- Impression  Marginal cord insertion. Patient returned for fetal growth  assessment.  Hypothyroidism. Patient takes levothyroxine 75 micrograms  daily. Recent TSH level was normal.  Patient had mild transaminitis 2 months ago.  She does not have gestational diabetes. BP at our office  today is 117/60 mm Hg.  On ultrasound, the estimated fetal weight is at the 97th  percentile. AC measurement is at the 99th percentile.  Amniotic fluid is normal. Incidental findings include:  -Right atrial and ventricular dilations are seen. Tricuspid  regurgitation is present.  -Mild cardiomegaly is seen.  -Ductal constriction is not seen but ductus arteriosus  appeared tortuous (tortuosity alone can be a normal finding in  late gestation).  -No evidence of hydrops.  Antenatal testing is reassuring. NST is reactive. BPP 10/10.  I explained the findings and the possibility of mild ductal  constriction. Patient takes low-dose aspirin (no NSAIDS).  I discussed the benefit of delivery at 37 weeks' gestation.  Fetal echocardiography is likely to delay delivery.  Patient will discuss over the weekend and with Dr. Elgie Congo at  her prenatal visit on Monday.  Will send an in-basket message to Dr. Elgie Congo. ---------------------------------------------------------------------- Recommendations  -BPP next week if  undelivered. ----------------------------------------------------------------------                  Tama High, MD Electronically Signed Final Report   04/01/2021 03:32 pm Assessment and Plan:  Pregnancy: G1P0 at [redacted]w[redacted]d 1. [redacted] weeks gestation of pregnancy   2. Hypothyroidism affecting pregnancy in third trimester Pt continues with synthroid  3. Supervision of high risk primigravida of advanced maternal age, antepartum Per MFM, recommends delivery due to fetal cardiac anomaly  4. Marginal insertion of umbilical cord affecting management of mother Recent BPP 8/8  Term labor symptoms and general obstetric precautions including but not limited to vaginal bleeding, contractions, leaking of fluid and fetal movement were reviewed in detail with the patient.  Please refer to After Visit Summary for other counseling recommendations.   No follow-ups on file.   LLynnda Shields MD Faculty Attending Center for [facility]

## 2021-04-05 ENCOUNTER — Encounter: Payer: Self-pay | Admitting: *Deleted

## 2021-04-05 ENCOUNTER — Encounter (HOSPITAL_COMMUNITY): Payer: Self-pay | Admitting: Obstetrics and Gynecology

## 2021-04-05 ENCOUNTER — Inpatient Hospital Stay (HOSPITAL_COMMUNITY): Payer: 59

## 2021-04-05 ENCOUNTER — Inpatient Hospital Stay (HOSPITAL_COMMUNITY)
Admission: AD | Admit: 2021-04-05 | Discharge: 2021-04-08 | DRG: 768 | Disposition: A | Discharge status: Home / Self Care | Payer: 59 | Attending: Obstetrics & Gynecology | Admitting: Obstetrics & Gynecology

## 2021-04-05 DIAGNOSIS — E039 Hypothyroidism, unspecified: Secondary | ICD-10-CM | POA: Diagnosis not present

## 2021-04-05 DIAGNOSIS — O358XX0 Maternal care for other (suspected) fetal abnormality and damage, not applicable or unspecified: Principal | ICD-10-CM | POA: Diagnosis present

## 2021-04-05 DIAGNOSIS — O99344 Other mental disorders complicating childbirth: Secondary | ICD-10-CM | POA: Diagnosis not present

## 2021-04-05 DIAGNOSIS — O4433 Partial placenta previa with hemorrhage, third trimester: Secondary | ICD-10-CM | POA: Diagnosis not present

## 2021-04-05 DIAGNOSIS — O3663X0 Maternal care for excessive fetal growth, third trimester, not applicable or unspecified: Secondary | ICD-10-CM | POA: Diagnosis present

## 2021-04-05 DIAGNOSIS — O26619 Liver and biliary tract disorders in pregnancy, unspecified trimester: Secondary | ICD-10-CM | POA: Diagnosis not present

## 2021-04-05 DIAGNOSIS — R7989 Other specified abnormal findings of blood chemistry: Secondary | ICD-10-CM

## 2021-04-05 DIAGNOSIS — F429 Obsessive-compulsive disorder, unspecified: Secondary | ICD-10-CM | POA: Diagnosis not present

## 2021-04-05 DIAGNOSIS — O43123 Velamentous insertion of umbilical cord, third trimester: Secondary | ICD-10-CM | POA: Diagnosis present

## 2021-04-05 DIAGNOSIS — O99284 Endocrine, nutritional and metabolic diseases complicating childbirth: Secondary | ICD-10-CM | POA: Diagnosis present

## 2021-04-05 DIAGNOSIS — Z20822 Contact with and (suspected) exposure to covid-19: Secondary | ICD-10-CM | POA: Diagnosis present

## 2021-04-05 DIAGNOSIS — Z3A38 38 weeks gestation of pregnancy: Secondary | ICD-10-CM | POA: Diagnosis not present

## 2021-04-05 DIAGNOSIS — O283 Abnormal ultrasonic finding on antenatal screening of mother: Secondary | ICD-10-CM | POA: Diagnosis present

## 2021-04-05 DIAGNOSIS — O09519 Supervision of elderly primigravida, unspecified trimester: Secondary | ICD-10-CM

## 2021-04-05 LAB — SARS CORONAVIRUS 2 (TAT 6-24 HRS): SARS Coronavirus 2: NEGATIVE

## 2021-04-05 LAB — CBC
HCT: 32.3 % — ABNORMAL LOW (ref 36.0–46.0)
Hemoglobin: 11.2 g/dL — ABNORMAL LOW (ref 12.0–15.0)
MCH: 30.6 pg (ref 26.0–34.0)
MCHC: 34.7 g/dL (ref 30.0–36.0)
MCV: 88.3 fL (ref 80.0–100.0)
Platelets: 264 10*3/uL (ref 150–400)
RBC: 3.66 MIL/uL — ABNORMAL LOW (ref 3.87–5.11)
RDW: 13.5 % (ref 11.5–15.5)
WBC: 11.3 10*3/uL — ABNORMAL HIGH (ref 4.0–10.5)
nRBC: 0 % (ref 0.0–0.2)

## 2021-04-05 MED ORDER — TERBUTALINE SULFATE 1 MG/ML IJ SOLN: 0.2500 mg | Freq: Once | INTRAMUSCULAR | Status: DC | PRN

## 2021-04-05 MED ORDER — FENTANYL CITRATE (PF) 100 MCG/2ML IJ SOLN: 50.0000 ug | INTRAMUSCULAR | Status: DC | PRN

## 2021-04-05 MED ORDER — SOD CITRATE-CITRIC ACID 500-334 MG/5ML PO SOLN: 30.0000 mL | ORAL | Status: DC | PRN

## 2021-04-05 MED ORDER — OXYTOCIN-SODIUM CHLORIDE 30-0.9 UT/500ML-% IV SOLN
2.5000 [IU]/h | INTRAVENOUS | Status: DC
  Administered 2021-04-06: 7.5 [IU]/h via INTRAVENOUS
  Filled 2021-04-05: qty 500

## 2021-04-05 MED ORDER — MISOPROSTOL 25 MCG QUARTER TABLET: 25.0000 ug | ORAL_TABLET | ORAL | Status: DC | PRN

## 2021-04-05 MED ORDER — ACETAMINOPHEN 325 MG PO TABS
650.0000 mg | ORAL_TABLET | ORAL | Status: DC | PRN
  Filled 2021-04-05: qty 2

## 2021-04-05 MED ORDER — LEVOTHYROXINE SODIUM 25 MCG PO TABS
75.0000 ug | ORAL_TABLET | Freq: Every day | ORAL | Status: DC
  Administered 2021-04-06 – 2021-04-08 (×3): 75 ug via ORAL
  Filled 2021-04-05: qty 3
  Filled 2021-04-05 (×2): qty 1

## 2021-04-05 MED ORDER — LACTATED RINGERS IV SOLN: 500.0000 mL | INTRAVENOUS | Status: DC | PRN

## 2021-04-05 MED ORDER — OXYTOCIN-SODIUM CHLORIDE 30-0.9 UT/500ML-% IV SOLN
1.0000 m[IU]/min | INTRAVENOUS | Status: DC
  Administered 2021-04-05: 2 m[IU]/min via INTRAVENOUS
  Filled 2021-04-05: qty 500

## 2021-04-05 MED ORDER — OXYTOCIN BOLUS FROM INFUSION
333.0000 mL | Freq: Once | INTRAVENOUS | Status: AC
  Administered 2021-04-06: 333 mL via INTRAVENOUS

## 2021-04-05 MED ORDER — LIDOCAINE HCL (PF) 1 % IJ SOLN: 30.0000 mL | INTRAMUSCULAR | Status: DC | PRN

## 2021-04-05 MED ORDER — OXYCODONE-ACETAMINOPHEN 5-325 MG PO TABS: 1.0000 | ORAL_TABLET | ORAL | Status: DC | PRN

## 2021-04-05 MED ORDER — LACTATED RINGERS IV SOLN: INTRAVENOUS | Status: DC

## 2021-04-05 MED ORDER — MISOPROSTOL 50MCG HALF TABLET
50.0000 ug | ORAL_TABLET | ORAL | Status: DC | PRN
  Administered 2021-04-05: 50 ug via BUCCAL
  Filled 2021-04-05: qty 1

## 2021-04-05 MED ORDER — SERTRALINE HCL 50 MG PO TABS
100.0000 mg | ORAL_TABLET | Freq: Every day | ORAL | Status: DC
  Administered 2021-04-08: 100 mg via ORAL
  Filled 2021-04-05 (×2): qty 2
  Filled 2021-04-05: qty 1

## 2021-04-05 MED ORDER — ONDANSETRON HCL 4 MG/2ML IJ SOLN
4.0000 mg | Freq: Four times a day (QID) | INTRAMUSCULAR | Status: DC | PRN
  Administered 2021-04-06: 4 mg via INTRAVENOUS
  Filled 2021-04-05: qty 2

## 2021-04-05 NOTE — Progress Notes (Signed)
Pt was up to the bathroom and felt dizzy and like she was going to "pass out".  She was trying to have a bowel movement.  Escorted back to bed to assess fhr tracing that appeared to be in 33s.  Once cardio adjusted, fhr 150bpm.  Unsure if prolonged decel or tracing maternal heartrate during this time.  Fluid bolus given and pt feeling better with color returned to her lips.

## 2021-04-05 NOTE — H&P (Addendum)
OBSTETRIC ADMISSION HISTORY AND PHYSICAL  Sarah RABALAIS is a 40 y.o. female G1P0 with IUP at 46w1dby LMP presenting for IOL due to fetal cardiac anomalies. She reports +FMs, No LOF, no VB, no blurry vision, headaches or peripheral edema, and RUQ pain.  She plans on breast feeding. She requests condoms for birth control.  [facility]: By LMP --->  Estimated Date of Delivery: 04/18/21  Sono:    '@[redacted]w[redacted]d'$ , CWD, normal anatomy, cephalic presentation, posterior placental lie, 295g, 66% EFW  '@[redacted]w[redacted]d'$ , CWD, abnormal cardiac anatomy (see below), cephalic presentation, posterior placental lie, 3926 g, 97% EFW  Prenatal History/Complications:  AMA: LR NIPS Marginal placental cord insertion- appeared normal on 03/04/21 [place]- on levothyroxine, euthyroid Transaminitis: Elevated LFTs, normal RUQ [place] Fetal cardiac anomalies: Right atrial and ventricular dilations, tricuspid regurgitation, tortuous ductus arteriosus, mild cardiomegaly seen on 04/01/21 [place]ess[place] comp[place] disorder: on zoloft 100 mg, stable. Macrosomia: 97th percentile, AC>HC   Past Medical History: Past Medical History:  Diagnosis Date   Hypothyroidism    OCD (obsessive compulsive disorder)     Past Surgical History: Past Surgical History:  Procedure Laterality Date   WISDOM TOOTH EXTRACTION      Obstetrical History: OB History     Gravida  1   Para      Term      Preterm      AB      Living         SAB      IAB      Ectopic      Multiple      Live Births  0           Social History Social History   Socioeconomic History   Marital status: Married    Spouse name: Not on file   Number of children: Not on file   Years of education: Not on file   Highest education level: Not on file  Occupational History   Not on file  Tobacco Use   Smoking status: Never   Smokeless tobacco: Never  Vaping Use   Vaping Use: Never used  Substance and  Sexual Activity   Alcohol use: Not Currently   Drug use: Never   Sexual activity: Yes    Birth control/protection: None  Other Topics Concern   Not on file  Social History Narrative   Not on file   Social Determinants of Health   Financial Resource Strain: Not on file  Food Insecurity: No Food Insecurity   Worried About Running Out of Food in the Last Year: Never true   RGalenain the Last Year: Never true  Transportation Needs: No Transportation Needs   Lack of Transportation (Medical): No   Lack of Transportation (Non-Medical): No  Physical Activity: Not on file  Stress: Not on file  Social Connections: Not on file    Family History: Family History  Problem Relation Age of Onset   Breast cancer Neg Hx     Allergies: No Known Allergies  Medications Prior to Admission  Medication Sig Dispense Refill Last Dose   aspirin 81 MG EC tablet TAKE 1 TABLET BY MOUTH DAILY. TAKE AFTER 12 WEEKS FOR PREVENTION OF PREECLAMPSIA LATER IN PREGNANCY 300 tablet 2    famotidine (PEPCID) 20 MG tablet Take 20 mg by mouth daily.      levothyroxine (SYNTHROID) 75 MCG tablet Take 1 tablet by mouth on an empty  stomach in the morning 90 tablet 3    omega-3 acid ethyl esters (LOVAZA) 1 g capsule Take by mouth 2 (two) times daily.      Prenatal Vit-Fe Fumarate-FA (PRENATAL MULTIVITAMIN) TABS tablet Take 1 tablet by mouth daily at 12 noon.      sertraline (ZOLOFT) 100 MG tablet Take 1 tablet by mouth once a day 90 tablet 1      Review of Systems   All systems reviewed and negative except as stated in HPI  Blood pressure 108/60, pulse 81, resp. rate 18, height '5\' 6"'$  (1.676 m), weight 83 kg, last menstrual period 07/12/2020. General appearance: alert, cooperative, appears stated age, and no distress Lungs: clear to auscultation bilaterally Heart: regular rate and rhythm Abdomen: soft, non-tender; bowel sounds normal Pelvic: 2/50/-2 exam by Dr. Chauncey Reading, normal external female  genitalia Extremities: Homans sign is negative, no sign of DVT DTR's 2+ Presentation: cephalic Fetal monitoringBaseline: 135 bpm, Variability: Good {> 6 bpm), Accelerations: Reactive, and Decelerations: Absent Uterine activity present, irregular    Prenatal labs: ABO, Rh: --/--/AB POS (08/09 1535) Antibody: NEG (08/09 1535) Rubella: 9.67 (02/18 1117) RPR: Non Reactive (06/01 0918)  HBsAg: Negative (02/18 1117)  HIV: Non Reactive (06/01 0918)  GBS: Negative/-- (08/01 1004)  1 hr Glucola 84/152/115 Genetic screening  LR NIPS Anatomy US marginal cord, cardiac abnormalities  Prenatal Transfer Tool  Maternal Diabetes: No Genetic Screening: Normal Maternal Ultrasounds/Referrals: Fetal Heart Anomalies Fetal Ultrasounds or other Referrals:  None Maternal Substance Abuse:  No Significant Maternal Medications:  Meds include: Syntroid, Zoloft Significant Maternal Lab Results: Group B Strep negative  Results for orders placed or performed during the hospital encounter of 04/05/21 (from the past 24 hour(s))  CBC   Collection Time: 04/05/21  3:13 PM  Result Value Ref Range   WBC 11.3 (H) 4.0 - 10.5 K/uL   RBC 3.66 (L) 3.87 - 5.11 MIL/uL   Hemoglobin 11.2 (L) 12.0 - 15.0 g/dL   HCT 32.3 (L) 36.0 - 46.0 %   MCV 88.3 80.0 - 100.0 fL   MCH 30.6 26.0 - 34.0 pg   MCHC 34.7 30.0 - 36.0 g/dL   RDW 13.5 11.5 - 15.5 %   Platelets 264 150 - 400 K/uL   nRBC 0.0 0.0 - 0.2 %  Type and screen   Collection Time: 04/05/21  3:35 PM  Result Value Ref Range   ABO/RH(D) AB POS    Antibody Screen NEG    Sample Expiration      04/08/2021,2359 Performed at [facility], [address], [place], [place] [address]     Patient Active Problem List   Diagnosis Date Noted   [redacted] weeks gestation of pregnancy 04/04/2021   Abnormal fetal ultrasound 04/04/2021   Marginal insertion of umbilical cord affecting management of mother 11/23/2020   Elevated LFTs 11/14/2020   Supervision of high risk  primigravida of advanced maternal age, antepartum 10/15/2020   Hypothyroidism affecting pregnancy 10/15/2020   Obsessive-compulsive disorder 10/15/2020    Assessment/Plan:  Sarah Estrada is a 40 y.o. G1P0 at 28w1dhere for IOL for fetal cardiac abnormalities.  #Labor: G1P0, will start with cytotec, FB placed. Will hopefully be able to start pit in 4 hours.  #Macrosomia: 97th percentile on 8/5 at 3926g, AC>HC, expect will be 4000g. G1P0.  #Fetal cardiac anomalies: Right atrial and ventricular dilations, tricuspid regurgitation, tortuous ductus arteriosus, mild cardiomegaly seen on 04/01/21 [place] MFM recommended 38w induction, will notify NICU at delivery.   #  AMA: LR NIPS  #Marginal placental cord Insertion- appeared normal on 03/04/21 [place]  #Hypothyroidism- on levothyroxine, euthyroid. Home levothyroxine ordered.  #Transaminitis: Elevated LFTs, repeat LFTs normal 01/26/21, normal RUQ [place]. No RUQ pain. Monitor.  #Obsessive compulsive disorder: on zoloft 100 mg, stable. SSRI ordered.  #Pain: prn #FWB: Cat I #ID:  GBS neg #MOF: breast #MOC:condoms #Circ:  N/a  Gladys Damme, MD  04/05/2021, 4:36 PM   GME ATTESTATION:  I saw and evaluated the patient. I agree with the findings and the plan of care as documented in the resident's note. I have made changes to documentation as necessary.  Vilma Meckel, MD OB Fellow, [place] for [place] 04/05/2021 8:50 PM

## 2021-04-05 NOTE — Progress Notes (Signed)
Labor Progress Note Sarah Estrada is a 40 y.o. G1P0 at 25w1dpresented for IOL for fetal cardiac anomalies S:  Patient is resting comfortably, she feels her contractions but they are manageable since her foley came out. She states the foley came out around 8:20pm  O:  BP 109/71   Pulse 68   Temp 97.8 F (36.6 C) (Oral)   Resp 16   Ht '5\' 6"'$  (1.676 m)   Wt 83 kg   LMP 07/12/2020 (Approximate)   BMI 29.54 kg/m  EFM: 140bpm/moderate variability/+ accels, no decels Toco: q2-4 min  CVE: Dilation: 2 Effacement (%): 50 Cervical Position: Posterior Station: -2 Presentation: Vertex Exam by:: Dr MChauncey Reading  A&P: 40y.o. G1P0 373w1dOL fetal cardiac anomalies #Labor: Progressing well. S/p cytotect x 1 and foley bulb out @ 2020. Will recheck and decide on pitocin after 4 hrs from cytotec #Pain: Epidural as desired #FWB: Cat 1 #GBS negative #Macrosomia: 97th percentile on 8/5 at 3926g, AC>HC, expect will be 4000g. G1P0. #Fetal cardiac anomalies: Right atrial and ventricular dilations, tricuspid regurgitation, tortuous ductus arteriosus, mild cardiomegaly seen on 04/01/21 [place] recommended 38w induction, will notify NICU at delivery #Marginal placental cord Insertion- appeared normal on 03/04/21 [place]- on levothyroxine, euthyroid. Home levothyroxine ordered. #Transaminitis: Elevated LFTs, repeat LFTs normal 01/26/21, normal RUQ [place] RUQ pain. Monitor. #Obsessive compulsive disorder: on zoloft 100 mg, stable. SSRI ordered  AnWaldon MerlMD 8:54 PM

## 2021-04-06 ENCOUNTER — Other Ambulatory Visit: Payer: Self-pay

## 2021-04-06 ENCOUNTER — Inpatient Hospital Stay (HOSPITAL_COMMUNITY): Payer: 59 | Admitting: Anesthesiology

## 2021-04-06 DIAGNOSIS — O26619 Liver and biliary tract disorders in pregnancy, unspecified trimester: Secondary | ICD-10-CM

## 2021-04-06 DIAGNOSIS — O99284 Endocrine, nutritional and metabolic diseases complicating childbirth: Secondary | ICD-10-CM

## 2021-04-06 DIAGNOSIS — Z3A38 38 weeks gestation of pregnancy: Secondary | ICD-10-CM

## 2021-04-06 DIAGNOSIS — O3663X0 Maternal care for excessive fetal growth, third trimester, not applicable or unspecified: Secondary | ICD-10-CM

## 2021-04-06 DIAGNOSIS — O4433 Partial placenta previa with hemorrhage, third trimester: Secondary | ICD-10-CM

## 2021-04-06 LAB — CBC
HCT: 31.2 % — ABNORMAL LOW (ref 36.0–46.0)
HCT: 26.7 % — ABNORMAL LOW (ref 36.0–46.0)
Hemoglobin: 10.6 g/dL — ABNORMAL LOW (ref 12.0–15.0)
Hemoglobin: 9.1 g/dL — ABNORMAL LOW (ref 12.0–15.0)
MCH: 29.9 pg (ref 26.0–34.0)
MCH: 29.6 pg (ref 26.0–34.0)
MCHC: 34 g/dL (ref 30.0–36.0)
MCHC: 34.1 g/dL (ref 30.0–36.0)
MCV: 88.1 fL (ref 80.0–100.0)
MCV: 87 fL (ref 80.0–100.0)
Platelets: 275 10*3/uL (ref 150–400)
Platelets: 200 10*3/uL (ref 150–400)
RBC: 3.54 MIL/uL — ABNORMAL LOW (ref 3.87–5.11)
RBC: 3.07 MIL/uL — ABNORMAL LOW (ref 3.87–5.11)
RDW: 13.6 % (ref 11.5–15.5)
RDW: 14.1 % (ref 11.5–15.5)
WBC: 16.1 10*3/uL — ABNORMAL HIGH (ref 4.0–10.5)
WBC: 20.4 10*3/uL — ABNORMAL HIGH (ref 4.0–10.5)
nRBC: 0 % (ref 0.0–0.2)
nRBC: 0 % (ref 0.0–0.2)

## 2021-04-06 LAB — ABO/RH: ABO/RH(D): AB POS

## 2021-04-06 LAB — DIC (DISSEMINATED INTRAVASCULAR COAGULATION)PANEL
D-Dimer, Quant: 4.85 ug/mL-FEU — ABNORMAL HIGH (ref 0.00–0.50)
Fibrinogen: 454 mg/dL (ref 210–475)
INR: 1.1 (ref 0.8–1.2)
Platelets: 262 10*3/uL (ref 150–400)
Prothrombin Time: 13.9 seconds (ref 11.4–15.2)
Smear Review: NONE SEEN
aPTT: 26 seconds (ref 24–36)

## 2021-04-06 LAB — RPR: RPR Ser Ql: NONREACTIVE

## 2021-04-06 LAB — POSTPARTUM HEMORRHAGE PROTOCOL (BB NOTIFICATION)

## 2021-04-06 LAB — PREPARE RBC (CROSSMATCH)

## 2021-04-06 MED ORDER — ACETAMINOPHEN 325 MG PO TABS: 650.0000 mg | ORAL_TABLET | ORAL | Status: DC | PRN

## 2021-04-06 MED ORDER — ZOLPIDEM TARTRATE 5 MG PO TABS: 5.0000 mg | ORAL_TABLET | Freq: Every evening | ORAL | Status: DC | PRN

## 2021-04-06 MED ORDER — TRANEXAMIC ACID-NACL 1000-0.7 MG/100ML-% IV SOLN
1000.0000 mg | INTRAVENOUS | Status: AC
  Administered 2021-04-06: 1000 mg via INTRAVENOUS

## 2021-04-06 MED ORDER — TRANEXAMIC ACID-NACL 1000-0.7 MG/100ML-% IV SOLN: 1000.0000 mg | Freq: Once | INTRAVENOUS | Status: DC | PRN

## 2021-04-06 MED ORDER — DIPHENHYDRAMINE HCL 25 MG PO CAPS: 25.0000 mg | ORAL_CAPSULE | Freq: Four times a day (QID) | ORAL | Status: DC | PRN

## 2021-04-06 MED ORDER — TRANEXAMIC ACID-NACL 1000-0.7 MG/100ML-% IV SOLN
INTRAVENOUS | Status: AC
  Administered 2021-04-06: 1000 mg
  Filled 2021-04-06: qty 100

## 2021-04-06 MED ORDER — METHYLERGONOVINE MALEATE 0.2 MG PO TABS
0.2000 mg | ORAL_TABLET | ORAL | Status: DC | PRN
Start: 2021-04-06 — End: 2021-04-08

## 2021-04-06 MED ORDER — IBUPROFEN 600 MG PO TABS
600.0000 mg | ORAL_TABLET | Freq: Four times a day (QID) | ORAL | Status: DC
  Administered 2021-04-06 – 2021-04-08 (×8): 600 mg via ORAL
  Filled 2021-04-06 (×8): qty 1

## 2021-04-06 MED ORDER — SIMETHICONE 80 MG PO CHEW: 80.0000 mg | CHEWABLE_TABLET | ORAL | Status: DC | PRN

## 2021-04-06 MED ORDER — ONDANSETRON HCL 4 MG/2ML IJ SOLN: 4.0000 mg | INTRAMUSCULAR | Status: DC | PRN

## 2021-04-06 MED ORDER — LACTATED RINGERS IV BOLUS
1000.0000 mL | Freq: Once | INTRAVENOUS | Status: DC
Start: 2021-04-06 — End: 2021-04-08

## 2021-04-06 MED ORDER — PRENATAL MULTIVITAMIN CH
1.0000 | ORAL_TABLET | Freq: Every day | ORAL | Status: DC
  Administered 2021-04-07 – 2021-04-08 (×2): 1 via ORAL
  Filled 2021-04-06 (×3): qty 1

## 2021-04-06 MED ORDER — MEASLES, MUMPS & RUBELLA VAC IJ SOLR: 0.5000 mL | Freq: Once | INTRAMUSCULAR | Status: DC

## 2021-04-06 MED ORDER — DIBUCAINE (PERIANAL) 1 % EX OINT: 1.0000 "application " | TOPICAL_OINTMENT | CUTANEOUS | Status: DC | PRN

## 2021-04-06 MED ORDER — SENNOSIDES-DOCUSATE SODIUM 8.6-50 MG PO TABS
2.0000 | ORAL_TABLET | Freq: Every day | ORAL | Status: DC
  Administered 2021-04-07 – 2021-04-08 (×2): 2 via ORAL
  Filled 2021-04-06 (×2): qty 2

## 2021-04-06 MED ORDER — BENZOCAINE-MENTHOL 20-0.5 % EX AERO
1.0000 "application " | INHALATION_SPRAY | CUTANEOUS | Status: DC | PRN
  Administered 2021-04-06: 1 via TOPICAL
  Filled 2021-04-06: qty 56

## 2021-04-06 MED ORDER — METHYLERGONOVINE MALEATE 0.2 MG/ML IJ SOLN
0.2000 mg | INTRAMUSCULAR | Status: DC | PRN
Start: 2021-04-06 — End: 2021-04-08

## 2021-04-06 MED ORDER — TETANUS-DIPHTH-ACELL PERTUSSIS 5-2.5-18.5 LF-MCG/0.5 IM SUSY: 0.5000 mL | PREFILLED_SYRINGE | Freq: Once | INTRAMUSCULAR | Status: DC

## 2021-04-06 MED ORDER — FERROUS SULFATE 325 (65 FE) MG PO TABS
325.0000 mg | ORAL_TABLET | Freq: Two times a day (BID) | ORAL | Status: DC
  Administered 2021-04-06 – 2021-04-07 (×2): 325 mg via ORAL
  Filled 2021-04-06 (×2): qty 1

## 2021-04-06 MED ORDER — EPHEDRINE 5 MG/ML INJ
10.0000 mg | INTRAVENOUS | Status: DC | PRN
Start: 2021-04-06 — End: 2021-04-07

## 2021-04-06 MED ORDER — COCONUT OIL OIL
1.0000 "application " | TOPICAL_OIL | Status: DC | PRN
  Administered 2021-04-06: 1 via TOPICAL

## 2021-04-06 MED ORDER — PHENYLEPHRINE 40 MCG/ML (10ML) SYRINGE FOR IV PUSH (FOR BLOOD PRESSURE SUPPORT)
80.0000 ug | PREFILLED_SYRINGE | INTRAVENOUS | Status: DC | PRN
Start: 2021-04-06 — End: 2021-04-07

## 2021-04-06 MED ORDER — PHENYLEPHRINE 40 MCG/ML (10ML) SYRINGE FOR IV PUSH (FOR BLOOD PRESSURE SUPPORT): 80.0000 ug | PREFILLED_SYRINGE | INTRAVENOUS | Status: DC | PRN

## 2021-04-06 MED ORDER — SODIUM CHLORIDE 0.9% IV SOLUTION
Freq: Once | INTRAVENOUS | Status: AC
  Administered 2021-04-06 (×2): 500 mL via INTRAVENOUS

## 2021-04-06 MED ORDER — METHYLERGONOVINE MALEATE 0.2 MG/ML IJ SOLN
INTRAMUSCULAR | Status: AC
  Administered 2021-04-06: 0.2 mg
  Filled 2021-04-06: qty 1

## 2021-04-06 MED ORDER — MISOPROSTOL 200 MCG PO TABS
400.0000 ug | ORAL_TABLET | Freq: Once | ORAL | Status: AC
  Administered 2021-04-06: 400 ug via ORAL
  Filled 2021-04-06: qty 2

## 2021-04-06 MED ORDER — LACTATED RINGERS IV SOLN: 500.0000 mL | Freq: Once | INTRAVENOUS | Status: DC

## 2021-04-06 MED ORDER — FENTANYL-BUPIVACAINE-NACL 0.5-0.125-0.9 MG/250ML-% EP SOLN
12.0000 mL/h | EPIDURAL | Status: DC | PRN
Start: 2021-04-06 — End: 2021-04-07
  Filled 2021-04-06: qty 250

## 2021-04-06 MED ORDER — WITCH HAZEL-GLYCERIN EX PADS: 1.0000 "application " | MEDICATED_PAD | CUTANEOUS | Status: DC | PRN

## 2021-04-06 MED ORDER — LACTATED RINGERS IV SOLN: INTRAVENOUS | Status: DC

## 2021-04-06 MED ORDER — ONDANSETRON HCL 4 MG PO TABS: 4.0000 mg | ORAL_TABLET | ORAL | Status: DC | PRN

## 2021-04-06 MED ORDER — FENTANYL-BUPIVACAINE-NACL 0.5-0.125-0.9 MG/250ML-% EP SOLN
EPIDURAL | Status: DC | PRN
  Administered 2021-04-06: 12 mL/h via EPIDURAL

## 2021-04-06 MED ORDER — LIDOCAINE HCL (PF) 1 % IJ SOLN
INTRAMUSCULAR | Status: DC | PRN
  Administered 2021-04-06: 5 mL via EPIDURAL

## 2021-04-06 MED ORDER — METHYLERGONOVINE MALEATE 0.2 MG/ML IJ SOLN
0.2000 mg | Freq: Once | INTRAMUSCULAR | Status: AC
  Administered 2021-04-06: 0.2 mg via INTRAMUSCULAR
  Filled 2021-04-06: qty 1

## 2021-04-06 MED ORDER — MISOPROSTOL 200 MCG PO TABS
600.0000 ug | ORAL_TABLET | Freq: Once | ORAL | Status: AC
  Administered 2021-04-06: 600 ug via RECTAL

## 2021-04-06 MED ORDER — DIPHENHYDRAMINE HCL 50 MG/ML IJ SOLN
12.5000 mg | INTRAMUSCULAR | Status: DC | PRN
Start: 2021-04-06 — End: 2021-04-07

## 2021-04-06 MED ORDER — MISOPROSTOL 200 MCG PO TABS
ORAL_TABLET | ORAL | Status: AC
  Filled 2021-04-06: qty 5

## 2021-04-06 MED ORDER — EPHEDRINE 5 MG/ML INJ: 10.0000 mg | INTRAVENOUS | Status: DC | PRN

## 2021-04-06 NOTE — Discharge Summary (Signed)
Postpartum Discharge Summary  Date of Service updated 04/08/21     Patient Name: Sarah Estrada DOB: 20-Feb-1981 MRN: [id number]  Date of admission: 04/05/2021 Delivery date:04/06/2021  Delivering provider: Waldon Merl  Date of discharge: 04/08/2021  Admitting diagnosis: Abnormal fetal ultrasound [O28.3] Intrauterine pregnancy: [redacted]w[redacted]d    Secondary diagnosis:  Active Problems:   Abnormal fetal ultrasound   Postpartum hemorrhage  Additional problems: hypothyroidism, OCD, suspected fetal cardiac abnormalities, PPH Discharge diagnosis: Term Pregnancy Delivered and PPH                                              Post partum procedures:blood transfusion (2uPRBC, 1uFFP) Augmentation: Pitocin, Cytotec, and IP Foley Complications: [id number] Hospital course: Induction of Labor With Vaginal Delivery   40y.o. yo G1P0 at 354w2das admitted to the hospital 04/05/2021 for induction of labor.  Indication for induction:  suspected fetal cardiac abnormalities .  Patient had an  labor course as follows: Membrane Rupture Time/Date: 12:00 AM ,04/06/2021   Delivery Method:Vaginal, Spontaneous  Episiotomy: Right Mediolateral d/t FHR Lacerations:  2nd degree;Sulcus Rt sulcal Details of delivery can be found in separate delivery note.   230014mBL, cytotec, TXA, methergine, Bakri balloon, 2u PRBC, 1uFFP in L&D.  Postpartum, her bleeding and vital signs remained stable.  Hgb also stable at 9 without symptoms.  Patient had a routine postpartum course. Patient is discharged home 04/08/21.  Newborn Data: Birth date:04/06/2021  Birth time:7:34 AM  Gender:Female  Living status:Living  Apgars:8 ,8  Weight:3595 g   Magnesium Sulfate received: No BMZ received: No Rhophylac:N/A MMR:N/A T-DaP:Given prenatally Flu: given prenatally Transfusion:Yes  Physical exam  Vitals:   04/07/21 1258 04/07/21 1615 04/07/21 2038 04/07/21 2346  BP: (!) 101/43 102/60 (!) 91/56 (!) 95/56  Pulse: 71  (!) 102 74 74  Resp: _0 Temp: 98 F (36.7 C) 98.2 F (36.8 C) 98.1 F (36.7 C) 97.8 F (36.6 C)  TempSrc: Oral Oral Oral Oral  SpO2: 99% 99% 100% 99%  Weight:      Height:       General: alert, cooperative, and no distress Lochia: appropriate Uterine Fundus: firm Incision: N/A DVT Evaluation: No evidence of DVT seen on physical exam. Labs: Lab Results  Component Value Date   WBC 18.4 (H) 04/07/2021   HGB 9.0 (L) 04/07/2021   HCT 26.3 (L) 04/07/2021   MCV 88.0 04/07/2021   PLT 189 04/07/2021   CMP Latest Ref Rng & Units 01/26/2021  Glucose 65 - 99 mg/dL 83  BUN 6 - 20 mg/dL 10  Creatinine 0.57 - 1.00 mg/dL 0.54(L)  Sodium 134 - 144 mmol/L 134  Potassium 3.5 - 5.2 mmol/L 4.2  Chloride 96 - 106 mmol/L 101  CO2 20 - 29 mmol/L 21  Calcium 8.7 - 10.2 mg/dL 8.9  Total Protein 6.0 - 8.5 g/dL 5.5(L)  Total Bilirubin 0.0 - 1.2 mg/dL 0.3  Alkaline Phos 44 - 121 IU/L 66  AST 0 - 40 IU/L 32  ALT 0 - 32 IU/L 52(H)   EdiFlavia Shipperore: Edinburgh Postnatal Depression Scale Screening Tool 04/07/2021  I have been able to laugh and see the funny side of things. 0  I have looked forward with enjoyment to things. 0  I have blamed myself unnecessarily when things went wrong. 1  I have been anxious  or worried for no good reason. 0  I have felt scared or panicky for no good reason. 0  Things have been getting on top of me. 1  I have been so unhappy that I have had difficulty sleeping. 0  I have felt sad or miserable. 0  I have been so unhappy that I have been crying. 0  The thought of harming myself has occurred to me. 0  Edinburgh Postnatal Depression Scale Total 2     After visit meds:  Allergies as of 04/08/2021   No Known Allergies      Medication List     STOP taking these medications    aspirin 81 MG EC tablet       TAKE these medications    acetaminophen 325 MG tablet Commonly known as: Tylenol Take 2 tablets (650 mg total) by mouth every 6 (six) hours  as needed for moderate pain or mild pain (for pain scale < 4).   calcium carbonate 500 MG chewable tablet Commonly known as: TUMS - dosed in mg elemental calcium Chew 1-2 tablets by mouth daily as needed for indigestion or heartburn.   famotidine 20 MG tablet Commonly known as: PEPCID Take 20-40 mg by mouth daily as needed for heartburn.   ibuprofen 600 MG tablet Commonly known as: ADVIL Take 1 tablet (600 mg total) by mouth every 6 (six) hours as needed.   levothyroxine 75 MCG tablet Commonly known as: SYNTHROID Take 1 tablet by mouth on an empty stomach in the morning What changed:  how to take this when to take this   OMEGA-3-ACID ETHYL ESTERS PO Take 2 g by mouth daily.   prenatal multivitamin Tabs tablet Take 3 tablets by mouth daily at 12 noon.   sertraline 100 MG tablet Commonly known as: ZOLOFT Take 1 tablet by mouth once a day What changed:  how much to take how to take this when to take this         Discharge home in stable condition Infant Feeding: Breast Infant Disposition:home with mother Discharge instruction: per After Visit Summary and Postpartum booklet. Activity: Advance as tolerated. Pelvic rest for 6 weeks.  Diet: routine diet Future Appointments:No future appointments. Follow up Visit:  [place] [facility] Follow up in 5 week(s).   Specialty: Obstetrics and Gynecology Contact information: [place] [id number] [phone]               Roma Schanz, CNM  P [facility] Admin Pool Please schedule this patient for PP visit in: 1-2TKK  Delivery complicated by postpartum hemorrhage  Low risk pregnancy complicated by: hypothyroidism, elevated LFTs, fetal cardiac abnormalities  Delivery mode:  SVD  Anticipated Birth Control:  Condoms  PP Procedures needed: none  Schedule Integrated BH visit: no  Provider: Any provider    04/08/2021 [name], DO

## 2021-04-06 NOTE — Progress Notes (Signed)
Labor Progress Note Sarah Estrada is a 40 y.o. G1P0 at 29w1dpresented for IOL for fetal cardiac anomalies S:  Patient is resting comfortably, feels much better since the epidural  O:  BP (!) 99/55   Pulse 75   Temp 98.1 F (36.7 C) (Oral)   Resp 17   Ht '5\' 6"'$  (1.676 m)   Wt 83 kg   LMP 07/12/2020 (Approximate)   BMI 29.54 kg/m  EFM: 125bpm/moderate variability/+ accels, no decels Toco: q2 min  CVE: Dilation: 5 Effacement (%): 90 Cervical Position: Middle Station: 0 Presentation: Vertex Exam by:: E Chipps RN   A&P: 40y.o. G1P0 382w1dOL fetal cardiac anomalies #Labor: S/p cytotect x 2 and foley bulb out @ 2020. Pitocin titrated as per protocol. If no change next check will place IUPC #Pain: Epidural in place #FWB: Cat 1 #GBS negative #Macrosomia: 97th percentile on 8/5 at 3926g, AC>HC, expect will be 4000g. G1P0. #Fetal cardiac anomalies: Right atrial and ventricular dilations, tricuspid regurgitation, tortuous ductus arteriosus, mild cardiomegaly seen on 04/01/21 [place] recommended 38w induction, will notify NICU at delivery #Marginal placental cord Insertion- appeared normal on 03/04/21 [place]orea[place]othyroidism- on levothyroxine, euthyroid. Home levothyroxine ordered. #Transaminitis: Elevated LFTs, repeat LFTs normal 01/26/21, normal RUQ [place] RUQ pain. Monitor. #Obsessive compulsive disorder: on zoloft 100 mg, stable. SSRI ordered  AnWaldon MerlMD 2:23 AM

## 2021-04-06 NOTE — Anesthesia Postprocedure Evaluation (Signed)
Anesthesia Post Note  Patient: Sarah Estrada  Procedure(s) Performed: AN AD [place]     Patient location during evaluation: Mother Baby Anesthesia Type: Epidural Level of consciousness: awake and alert Pain management: pain level controlled Vital Signs Assessment: post-procedure vital signs reviewed and stable Respiratory status: spontaneous breathing, nonlabored ventilation and respiratory function stable Cardiovascular status: stable Postop Assessment: no headache, no backache and epidural receding Anesthetic complications: no   No notable events documented.  Last Vitals:  Vitals:   04/06/21 1222 04/06/21 1225  BP: (!) 98/59 (!) 98/59  Pulse: 76 76  Resp:  14  Temp:  37.8 C  SpO2:      Last Pain:  Vitals:   04/06/21 1225  TempSrc: Oral  PainSc:    Pain Goal:                   Linetta Regner

## 2021-04-06 NOTE — Anesthesia Procedure Notes (Signed)
Epidural Patient location during procedure: OB Start time: 04/06/2021 12:57 AM End time: 04/06/2021 1:14 AM  Staffing Anesthesiologist: Barnet Glasgow, MD Performed: anesthesiologist   Preanesthetic Checklist Completed: patient identified, IV checked, site marked, risks and benefits discussed, surgical consent, monitors and equipment checked, pre-op evaluation and timeout performed  Epidural Patient position: sitting Prep: DuraPrep and site prepped and draped Patient monitoring: continuous pulse ox and blood pressure Approach: midline Location: L3-L4 Injection technique: LOR air  Needle:  Needle type: Tuohy  Needle gauge: 17 G Needle length: 9 cm and 9 Needle insertion depth: 7 cm Catheter type: closed end flexible Catheter size: 19 Gauge Catheter at skin depth: 12 cm Test dose: negative  Assessment Events: blood not aspirated, injection not painful, no injection resistance, no paresthesia and negative IV test  Additional Notes Patient identified. Risks/Benefits/Options discussed with patient including but not limited to bleeding, infection, nerve damage, paralysis, failed block, incomplete pain control, headache, blood pressure changes, nausea, vomiting, reactions to medication both or allergic, itching and postpartum back pain. Confirmed with bedside nurse the patient's most recent platelet count. Confirmed with patient that they are not currently taking any anticoagulation, have any bleeding history or any family history of bleeding disorders. Patient expressed understanding and wished to proceed. All questions were answered. Sterile technique was used throughout the entire procedure. Please see nursing notes for vital signs. Test dose was given through epidural needle and negative prior to continuing to dose epidural or start infusion. Warning signs of high block given to the patient including shortness of breath, tingling/numbness in hands, complete motor block, or any  concerning symptoms with instructions to call for help. Patient was given instructions on fall risk and not to get out of bed. All questions and concerns addressed with instructions to call with any issues.  1 Attempt (S) . Patient tolerated procedure well.

## 2021-04-06 NOTE — Lactation Note (Signed)
This note was copied from a baby's chart. Lactation Consultation Note  Patient Name: [name] [id number] Date: 04/06/2021 Reason for consult: Initial assessment;Nipple pain/trauma;Early term 37-38.6wks;Other (Comment) ([place] 2,3000, heart abnormality in infant) Age:38 hours  Baby cueing when [facility] entered room.  Infant placed STS.  Latched attempt on left side but mom was in too much pain.  She broke the latch and with LC assistance, latched infant on right side.  Wide gape and flanged lips noted.  Education provided while infant fed.  Mom was encouraged to post-pump after feeding.  21 and 24 flange tried, mom felt 21 were more comfortable.    BF Basics reviewed with family.  Brochure provided, BFSG,OP LC, and phone line info. provided.    DEBP reviewed with family. Washing, storing, and usage reviewed.    Maternal Data Has patient been taught Hand Expression?: Yes Does the patient have breastfeeding experience prior to this delivery?: No  Feeding Mother's Current Feeding Choice: Breast Milk  LATCH Score Latch: Grasps breast easily, tongue down, lips flanged, rhythmical sucking.  Audible Swallowing: A few with stimulation  Type of Nipple: Flat  Comfort (Breast/Nipple): Filling, red/small blisters or bruises, mild/mod discomfort (moderate discomfort, left nipple painful with redness noted, right nipple bruised with brusing on the areola as well.)  Hold (Positioning): Assistance needed to correctly position infant at breast and maintain latch.  LATCH Score: 6   Lactation Tools Discussed/Used Tools: Flanges;Comfort gels Flange Size: 21  Interventions Interventions: Breast feeding basics reviewed;Assisted with latch;Skin to skin;Breast massage;Hand express;Support pillows;DEBP;Comfort gels  Discharge Pump: DEBP;Employee Pump  Consult Status Consult Status: Follow-up Date: 04/07/21 Follow-up type: In-patient    Ferne Coe [facility] 04/06/2021, 7:06 PM

## 2021-04-06 NOTE — Progress Notes (Signed)
Code Hemorrhage initiated  [place], RN 04/06/2021 [phone]

## 2021-04-06 NOTE — Lactation Note (Signed)
This note was copied from a baby's chart. Lactation Consultation Note  Patient Name: Sarah Estrada [id number] Date: 04/06/2021 Reason for consult: L&D Initial assessment;Primapara;1st time breastfeeding;Early term 37-38.6wks;Other (Comment) (per RN ok to see dyad/ attempt to latch earlier /spoon fed 2.46m . RN had just finished weighing baby/ placed baby on moms chest.LC offered to assist to latch/ mom receptive/ able to hand expr drops / latched for 17 mins / swallows/per mom comfortable.) Latch of 9 - see below for details.  Age:66 hours - Delay in latch due to mom having a PPH , presently receiving blood and will be transferred to Room 108.  LC reviewed Breast feeding basics/ and mentioned to mom due to her PCamuy, post pumping after feedings would be indicated , but she has time since the baby had latched and fed well.    Maternal Data    Feeding Mother's Current Feeding Choice: Breast Milk  LATCH Score Latch: Grasps breast easily, tongue down, lips flanged, rhythmical sucking.  Audible Swallowing: A few with stimulation  Type of Nipple: Everted at rest and after stimulation  Comfort (Breast/Nipple): Soft / non-tender  Hold (Positioning): Assistance needed to correctly position infant at breast and maintain latch.  LATCH Score: 8   Lactation Tools Discussed/Used    Interventions Interventions: Breast feeding basics reviewed;Assisted with latch;Skin to skin;Hand express;Breast compression;Adjust position;Support pillows;Education  Discharge    Consult Status Consult Status: Follow-up (from L/D) Date: 04/06/21 Follow-up type: In-patient    [place]8/05/2021, 10:52 AM

## 2021-04-06 NOTE — Lactation Note (Signed)
This note was copied from a baby's chart. Lactation Consultation Note  Patient Name: Sarah Estrada [id number] Date: 04/06/2021 Reason for consult: L&D Initial assessment;Primapara;1st time breastfeeding;Early term 37-38.6wks;Other (Comment) ([place] visit at 41mns / mom still being repaired/ extra bleeding/ will F/U) Age: 3549mins    Maternal Data    Feeding    LATCH Score                    Lactation Tools Discussed/Used    Interventions    Discharge    Consult Status Consult Status: Follow-up (when mom is stable) Date: 04/06/21 Follow-up type: In-patient    MC[place]8/05/2021, 8:15 AM

## 2021-04-06 NOTE — Anesthesia Preprocedure Evaluation (Addendum)
Anesthesia Evaluation  Patient identified by MRN, date of birth, ID band Patient awake    Reviewed: Allergy & Precautions, NPO status , Patient's Chart, lab work & pertinent test results  Airway Mallampati: II  TM Distance: >3 FB Neck ROM: Full    Dental no notable dental hx. (+) Teeth Intact, Dental Advisory Given   Pulmonary neg pulmonary ROS,    Pulmonary exam normal breath sounds clear to auscultation       Cardiovascular negative cardio ROS Normal cardiovascular exam Rhythm:Regular Rate:Normal     Neuro/Psych Anxiety negative neurological ROS     GI/Hepatic negative GI ROS, Neg liver ROS,   Endo/Other  Hypothyroidism   Renal/GU negative Renal ROS     Musculoskeletal   Abdominal   Peds  Hematology Lab Results      Component                Value               Date                      WBC                      11.3 (H)            04/05/2021                HGB                      11.2 (L)            04/05/2021                HCT                      32.3 (L)            04/05/2021                MCV                      88.3                04/05/2021                PLT                      264                 04/05/2021              Anesthesia Other Findings   Reproductive/Obstetrics (+) Pregnancy                            Anesthesia Physical Anesthesia Plan  ASA: 2  Anesthesia Plan: Epidural   Post-op Pain Management:    Induction:   PONV Risk Score and Plan:   Airway Management Planned:   Additional Equipment:   Intra-op Plan:   Post-operative Plan:   Informed Consent: I have reviewed the patients History and Physical, chart, labs and discussed the procedure including the risks, benefits and alternatives for the proposed anesthesia with the patient or authorized representative who has indicated his/her understanding and acceptance.       Plan Discussed with:    Anesthesia Plan Comments: (38.2 primagravida for LEA)  Anesthesia Quick Evaluation  

## 2021-04-07 DIAGNOSIS — O358XX0 Maternal care for other (suspected) fetal abnormality and damage, not applicable or unspecified: Secondary | ICD-10-CM | POA: Diagnosis not present

## 2021-04-07 DIAGNOSIS — O99284 Endocrine, nutritional and metabolic diseases complicating childbirth: Secondary | ICD-10-CM | POA: Diagnosis not present

## 2021-04-07 DIAGNOSIS — F429 Obsessive-compulsive disorder, unspecified: Secondary | ICD-10-CM | POA: Diagnosis not present

## 2021-04-07 DIAGNOSIS — E039 Hypothyroidism, unspecified: Secondary | ICD-10-CM | POA: Diagnosis not present

## 2021-04-07 DIAGNOSIS — O99344 Other mental disorders complicating childbirth: Secondary | ICD-10-CM | POA: Diagnosis not present

## 2021-04-07 DIAGNOSIS — Z20822 Contact with and (suspected) exposure to covid-19: Secondary | ICD-10-CM | POA: Diagnosis not present

## 2021-04-07 DIAGNOSIS — O43123 Velamentous insertion of umbilical cord, third trimester: Secondary | ICD-10-CM | POA: Diagnosis not present

## 2021-04-07 LAB — BPAM RBC
Blood Product Expiration Date: 202209032359
Blood Product Expiration Date: 202209062359
Blood Product Expiration Date: 202209112359
ISSUE DATE / TIME: 202208100754
ISSUE DATE / TIME: 202208100819
ISSUE DATE / TIME: 202208100819
Unit Type and Rh: 6200
Unit Type and Rh: 8400
Unit Type and Rh: 9500

## 2021-04-07 LAB — BPAM FFP
Blood Product Expiration Date: 202208152359
ISSUE DATE / TIME: 202208100953
Unit Type and Rh: 8400

## 2021-04-07 LAB — TYPE AND SCREEN
ABO/RH(D): AB POS
Antibody Screen: NEGATIVE
Unit division: 0
Unit division: 0
Unit division: 0

## 2021-04-07 LAB — CBC
HCT: 26.3 % — ABNORMAL LOW (ref 36.0–46.0)
Hemoglobin: 9 g/dL — ABNORMAL LOW (ref 12.0–15.0)
MCH: 30.1 pg (ref 26.0–34.0)
MCHC: 34.2 g/dL (ref 30.0–36.0)
MCV: 88 fL (ref 80.0–100.0)
Platelets: 189 10*3/uL (ref 150–400)
RBC: 2.99 MIL/uL — ABNORMAL LOW (ref 3.87–5.11)
RDW: 14.4 % (ref 11.5–15.5)
WBC: 18.4 10*3/uL — ABNORMAL HIGH (ref 4.0–10.5)
nRBC: 0 % (ref 0.0–0.2)

## 2021-04-07 LAB — PREPARE FRESH FROZEN PLASMA: Unit division: 0

## 2021-04-07 MED ORDER — FERROUS SULFATE 325 (65 FE) MG PO TABS: 325.0000 mg | ORAL_TABLET | ORAL | Status: DC

## 2021-04-07 NOTE — Lactation Note (Signed)
This note was copied from a baby's chart. Lactation Consultation Note  Patient Name: Sarah Estrada [id number] Date: 04/07/2021 Reason for consult: Follow-up assessment;Mother's request;Difficult latch;Early term 37-38.6wks;Nipple pain/trauma Age:38 hours  Infant fed DBM 20 ml just prior to [place] arrival.  Mom states nipples are sore from shallow latch. Mom taking a breast rest for now using comfort gels for nipple care.   Mom selected [employer] as her employee pump. A copy of Mothers insurance card completed and mother given receipt for employee pump.   Mom to call for latch assistance later in the day. Mom supplementing with DBM and working on increasing her milk supply using the DEBP.    Maternal Data    Feeding Mother's Current Feeding Choice: Breast Milk and Donor Milk Nipple Type: Slow - flow  LATCH Score                    Lactation Tools Discussed/Used Tools: Pump;Flanges;Comfort gels Flange Size: 21 Breast pump type: Double-Electric Breast Pump Pump Education: Setup, frequency, and cleaning;Milk Storage Reason for Pumping: increase stimulation Pumping frequency: every 3 hrs for 15 min  Interventions Interventions: Breast feeding basics reviewed;Breast compression;Comfort gels;DEBP;Education  Discharge Pump: Employee Pump  Consult Status Consult Status: Follow-up Date: 04/08/21 Follow-up type: In-patient    Dorothye Berni  [place] 04/07/2021, 3:34 PM

## 2021-04-07 NOTE — Anesthesia Postprocedure Evaluation (Signed)
Anesthesia Post Note  Patient: Sarah Estrada  Procedure(s) Performed: AN AD HOC LABOR EPIDURAL     Patient location during evaluation: OB High Risk Anesthesia Type: Epidural Level of consciousness: awake and alert Pain management: pain level controlled Vital Signs Assessment: post-procedure vital signs reviewed and stable Respiratory status: spontaneous breathing, nonlabored ventilation and respiratory function stable Cardiovascular status: stable Postop Assessment: no headache, no backache and epidural receding Anesthetic complications: no   No notable events documented.  Last Vitals:  Vitals:   04/06/21 2359 04/07/21 0500  BP: (!) 100/54 97/66  Pulse: 69 75  Resp: 16 16  Temp: 36.6 C 36.5 C  SpO2: 100% 100%    Last Pain:  Vitals:   04/07/21 0751  TempSrc:   PainSc: 0-No pain   Pain Goal: Patients Stated Pain Goal: 3 (04/06/21 1935)              Epidural/Spinal Function Cutaneous sensation: Normal sensation (04/07/21 0751)  Gilmer Mor

## 2021-04-07 NOTE — Progress Notes (Signed)
POSTPARTUM PROGRESS NOTE  Post Partum Day 1  Subjective:  Sarah Estrada is a 40 y.o. G1P0 s/p SVD with postpartum hemorrhage at [redacted]w[redacted]d  She reports she is doing well. No acute events overnight. She denies any problems with ambulating, voiding or po intake. Denies nausea or vomiting.  Pain is well controlled.  Lochia is within normal limits. Bakri balloon was removed last night without incident.  Foley was removed.  Pt has been up to restroom without incident. Objective: Blood pressure 97/66, pulse 75, temperature 97.7 F (36.5 C), temperature source Oral, resp. rate 16, height '5\' 6"'$  (1.676 m), weight 83 kg, last menstrual period 07/12/2020, SpO2 100 %.  Physical Exam:  General: alert, cooperative and no distress Chest: no respiratory distress Heart:regular rate, distal pulses intact Abdomen: soft, nontender,  Uterine Fundus: firm, appropriately tender DVT Evaluation: No calf swelling or tenderness Extremities: minimal edema Skin: warm, dry  Recent Labs    04/06/21 1922 04/07/21 0509  HGB 9.1* 9.0*  HCT 26.7* 26.3*    Assessment/Plan: Sarah DUDECKis a 40y.o. G1P0 s/p SVD at 355w3d PPD#1 -  Doing well Routine postpartum care Continue routine postpartum care.   LOS: 2 days   LaLynnda ShieldsMD Faculty attending 04/07/2021, 7:42 AM

## 2021-04-07 NOTE — Progress Notes (Signed)
MOB was referred for history of OCD. Referral screened out by Clinical Social Worker because  MOB's symptoms currently being treated with medication and/or therapy. Per MOB's [facility] records MOB has an active Rx for Zoloft and symptoms have been managed.   Please contact the Clinical Social Worker if needs arise, by [facility] request, or if MOB scores greater than 9/yes to question 10 on Edinburgh Postpartum Depression Screen.   Laurey Arrow, MSW, LCSW Clinical Social Work [phone]

## 2021-04-07 NOTE — Addendum Note (Signed)
Addendum  created 04/07/21 0819 by Alvy Bimler, CRNA   Clinical Note Signed

## 2021-04-07 NOTE — Lactation Note (Signed)
This note was copied from a baby's chart. Lactation Consultation Note  Patient Name: Girl Jnaya Magley [id number] Date: 04/07/2021 Reason for consult: Follow-up assessment;Nipple pain/trauma;Maternal endocrine disorder Age:38 hours  Mother resting. [employer] employee. Will decide later today on which pump she would like.  Mother states she is sore from latching.  Suggest lactation observing a latch today.   Mother states she pumped this morning because of soreness.  Recommend pumping after donor milk is given to baby q 3 hours.   Feeding Mother's Current Feeding Choice: Breast Milk and Donor Milk   Lactation Tools Discussed/Used Tools: Pump Flange Size: 21 Breast pump type: Double-Electric Breast Pump Pumping frequency: q 3 hours or after baby is given donor milk  Interventions Interventions: Education      Consult Status Consult Status: Follow-up Date: 04/07/21 Follow-up type: In-patient    Vivianne Master [facility] 04/07/2021, 10:47 AM

## 2021-04-08 ENCOUNTER — Other Ambulatory Visit (HOSPITAL_COMMUNITY): Payer: Self-pay

## 2021-04-08 MED ORDER — ACETAMINOPHEN 325 MG PO TABS
650.0000 mg | ORAL_TABLET | Freq: Four times a day (QID) | ORAL | 0 refills | Status: DC | PRN
  Filled 2021-04-08: qty 30, 4d supply, fill #0

## 2021-04-08 MED ORDER — IBUPROFEN 600 MG PO TABS
600.0000 mg | ORAL_TABLET | Freq: Four times a day (QID) | ORAL | 0 refills | Status: DC | PRN
  Filled 2021-04-08: qty 30, 8d supply, fill #0

## 2021-04-08 NOTE — Lactation Note (Signed)
This note was copied from a baby's chart. Lactation Consultation Note  Patient Name: Sarah Estrada [id number] Date: 04/08/2021 Reason for consult: Follow-up assessment;1st time breastfeeding;Nipple pain/trauma Age:38 hours  P1, Mother has cracks, abrasions on both nipples and has been pumping and giving donor milk because she has been too sore to latch. Latched baby in both laid back, football hold and with nipple shield and it was too painful to sustain. Noted baby cupping tongue.  Suggest discussing with Ped MD. Mother will pump until able to try latching baby.  Suggest outpatient appointment.  Reviewed engorgement care and monitoring voids/stools. Mother will supplement with formula once home.  Feeding Mother's Current Feeding Choice: Breast Milk and Donor Milk Nipple Type: Slow - flow  LATCH Score Latch: Grasps breast easily, tongue down, lips flanged, rhythmical sucking.  Audible Swallowing: A few with stimulation  Type of Nipple: Everted at rest and after stimulation  Comfort (Breast/Nipple): Engorged, cracked, bleeding, large blisters, severe discomfort  Hold (Positioning): Assistance needed to correctly position infant at breast and maintain latch.  LATCH Score: 6   Lactation Tools Discussed/Used Tools: Pump;Coconut oil;Comfort gels;Nipple Francine Graven Size: 21 Breast pump type: Double-Electric Breast Pump Reason for Pumping: soreness/ nipple trauma Pumping frequency: q 3 hours  Interventions Interventions: Breast feeding basics reviewed;Assisted with latch;Skin to skin;Hand express;Position options;Coconut oil;Comfort gels;DEBP;Education  Discharge Discharge Education: Engorgement and breast care;Warning signs for feeding baby;Outpatient recommendation Pump: Personal;DEBP  Consult Status Consult Status: Complete Date: 04/08/21    Vivianne Master [facility] 04/08/2021, 9:46 AM

## 2021-04-08 NOTE — Plan of Care (Signed)
Patient to be discharged home with printed instructions. Zeina Akkerman L Isaah Furry, RN  

## 2021-04-12 MED ORDER — NONFORMULARY OR COMPOUNDED ITEM: 1 refills | Status: AC

## 2021-04-13 ENCOUNTER — Encounter: Payer: Self-pay | Admitting: Family Medicine

## 2021-04-19 ENCOUNTER — Telehealth (HOSPITAL_COMMUNITY): Payer: Self-pay

## 2021-04-19 NOTE — Telephone Encounter (Signed)
  No answer. Left message to return nurse call.  Sharyn Lull [facility] 04/19/2021,1144

## 2021-04-21 ENCOUNTER — Encounter: Payer: Self-pay | Admitting: *Deleted

## 2021-04-22 ENCOUNTER — Telehealth: Payer: Self-pay | Admitting: *Deleted

## 2021-04-22 ENCOUNTER — Inpatient Hospital Stay (HOSPITAL_COMMUNITY)
Admission: AD | Admit: 2021-04-22 | Discharge: 2021-04-26 | DRG: 776 | Disposition: A | Discharge status: Home / Self Care | Payer: 59 | Attending: Obstetrics and Gynecology | Admitting: Obstetrics and Gynecology

## 2021-04-22 ENCOUNTER — Encounter (HOSPITAL_COMMUNITY): Payer: Self-pay | Admitting: Obstetrics and Gynecology

## 2021-04-22 ENCOUNTER — Other Ambulatory Visit: Payer: Self-pay

## 2021-04-22 ENCOUNTER — Inpatient Hospital Stay (HOSPITAL_COMMUNITY): Payer: 59

## 2021-04-22 DIAGNOSIS — R7989 Other specified abnormal findings of blood chemistry: Secondary | ICD-10-CM

## 2021-04-22 DIAGNOSIS — R109 Unspecified abdominal pain: Secondary | ICD-10-CM

## 2021-04-22 DIAGNOSIS — F99 Mental disorder, not otherwise specified: Secondary | ICD-10-CM | POA: Diagnosis present

## 2021-04-22 DIAGNOSIS — O8612 Endometritis following delivery: Principal | ICD-10-CM | POA: Diagnosis present

## 2021-04-22 DIAGNOSIS — O864 Pyrexia of unknown origin following delivery: Secondary | ICD-10-CM | POA: Diagnosis present

## 2021-04-22 DIAGNOSIS — F429 Obsessive-compulsive disorder, unspecified: Secondary | ICD-10-CM | POA: Diagnosis not present

## 2021-04-22 DIAGNOSIS — O09519 Supervision of elderly primigravida, unspecified trimester: Secondary | ICD-10-CM

## 2021-04-22 DIAGNOSIS — R509 Fever, unspecified: Secondary | ICD-10-CM | POA: Diagnosis not present

## 2021-04-22 DIAGNOSIS — N858 Other specified noninflammatory disorders of uterus: Secondary | ICD-10-CM | POA: Diagnosis not present

## 2021-04-22 LAB — URINALYSIS, ROUTINE W REFLEX MICROSCOPIC
Bacteria, UA: NONE SEEN
Bilirubin Urine: NEGATIVE
Glucose, UA: NEGATIVE mg/dL
Ketones, ur: NEGATIVE mg/dL
Nitrite: NEGATIVE
Protein, ur: NEGATIVE mg/dL
Specific Gravity, Urine: 1.005 (ref 1.005–1.030)
pH: 6 (ref 5.0–8.0)

## 2021-04-22 LAB — CBC WITH DIFFERENTIAL/PLATELET
Abs Immature Granulocytes: 0.21 10*3/uL — ABNORMAL HIGH (ref 0.00–0.07)
Basophils Absolute: 0.1 10*3/uL (ref 0.0–0.1)
Basophils Relative: 0 %
Eosinophils Absolute: 0 10*3/uL (ref 0.0–0.5)
Eosinophils Relative: 0 %
HCT: 33 % — ABNORMAL LOW (ref 36.0–46.0)
Hemoglobin: 10.7 g/dL — ABNORMAL LOW (ref 12.0–15.0)
Immature Granulocytes: 1 %
Lymphocytes Relative: 8 %
Lymphs Abs: 1.3 10*3/uL (ref 0.7–4.0)
MCH: 28.5 pg (ref 26.0–34.0)
MCHC: 32.4 g/dL (ref 30.0–36.0)
MCV: 87.8 fL (ref 80.0–100.0)
Monocytes Absolute: 1.4 10*3/uL — ABNORMAL HIGH (ref 0.1–1.0)
Monocytes Relative: 9 %
Neutro Abs: 12.8 10*3/uL — ABNORMAL HIGH (ref 1.7–7.7)
Neutrophils Relative %: 82 %
Platelets: 377 10*3/uL (ref 150–400)
RBC: 3.76 MIL/uL — ABNORMAL LOW (ref 3.87–5.11)
RDW: 13.5 % (ref 11.5–15.5)
WBC: 15.7 10*3/uL — ABNORMAL HIGH (ref 4.0–10.5)
nRBC: 0 % (ref 0.0–0.2)

## 2021-04-22 LAB — COMPREHENSIVE METABOLIC PANEL
ALT: 27 U/L (ref 0–44)
AST: 48 U/L — ABNORMAL HIGH (ref 15–41)
Albumin: 3.1 g/dL — ABNORMAL LOW (ref 3.5–5.0)
Alkaline Phosphatase: 90 U/L (ref 38–126)
Anion gap: 8 (ref 5–15)
BUN: 18 mg/dL (ref 6–20)
CO2: 25 mmol/L (ref 22–32)
Calcium: 8.9 mg/dL (ref 8.9–10.3)
Chloride: 103 mmol/L (ref 98–111)
Creatinine, Ser: 0.76 mg/dL (ref 0.44–1.00)
GFR, Estimated: >60 mL/min (ref >60)
Glucose, Bld: 96 mg/dL (ref 70–99)
Potassium: 3.7 mmol/L (ref 3.5–5.1)
Sodium: 136 mmol/L (ref 135–145)
Total Bilirubin: 0.6 mg/dL (ref 0.3–1.2)
Total Protein: 6.3 g/dL — ABNORMAL LOW (ref 6.5–8.1)

## 2021-04-22 MED ORDER — LACTATED RINGERS IV BOLUS
1000.0000 mL | Freq: Once | INTRAVENOUS | Status: AC
  Administered 2021-04-22: 1000 mL via INTRAVENOUS

## 2021-04-22 MED ORDER — ACETAMINOPHEN 500 MG PO TABS
1000.0000 mg | ORAL_TABLET | Freq: Once | ORAL | Status: AC
  Administered 2021-04-22: 1000 mg via ORAL
  Filled 2021-04-22: qty 2

## 2021-04-22 NOTE — Telephone Encounter (Signed)
Call transferred from front desk . Najla states she called after hours nurse who told her to go to ER last night or call us today to be seen. She reports last night about 8pm she has fever 104.0 and shivering about 30 minutes. States she took ibuprofen '400mg'$  and drank fluids with electroloytes because she thought she was dehydrated. States her BP was 94/64 , pulse maybe 130. States by 11:30 last night felt better, Temperature about 99, took ibuprofen '400mg'$  about 11:30.  States this am Temperature =98.7, took BP at my request =104/63. States she feels better today, Denies burning or pain with urination. Denies visual disturbances, or edema. Denies any issues with breastfeeding or red, tender areas on breasts. States maybe back was tender when had fever. Denies respiratory symptoms such as cough, sore throat, SOB. Discussed with Dr. Cy Blamer and advised patient does not need to be seen today but if she develops a fever again of 100.4 or higher needs to be evaluated at [facility] MAU since she is recent postpartum. If she develps respiratory sxs to do covid test. She voices understanding Rowan Pollman,RN

## 2021-04-22 NOTE — MAU Note (Signed)
IN RESTROOM

## 2021-04-22 NOTE — MAU Provider Note (Signed)
History     CSN: [id number]  Arrival date and time: 04/22/21 2024   Event Date/Time   First Provider Initiated Contact with Patient 04/22/21 2154      Chief Complaint  Patient presents with   Headache   Sarah Estrada is a 40 y.o. G1P1001 at 79 Days Postpartum who receives care at [facility]  She presents today for Headache and Fever.  She reports her headache is located in the frontal area and she describes it as "a dull throb with no real sharp pains."  Patient states she has tried ibuprofen with no relief.  She reports her last dose was at 0930 and was '400mg'$ .  She rates the headache a 2/10 and states "it is not bad."  She reports her fever "spiked last night" and was 104.  She states it has gradually improved and was not present this morning.  She reports that this evening she started "getting really chilly again" and noted her fever was 102.  She reports that she is breastfeeding and pumping with no issues other than "sore nipples."  She reports she uses "all purpose nipple ointment, which has helped."     OB History     Gravida  1   Para      Term      Preterm      AB      Living  1      SAB      IAB      Ectopic      Multiple      Live Births  1           Past Medical History:  Diagnosis Date   Hypothyroidism    OCD (obsessive compulsive disorder)     Past Surgical History:  Procedure Laterality Date   WISDOM TOOTH EXTRACTION      Family History  Problem Relation Age of Onset   Breast cancer Neg Hx     Social History   Tobacco Use   Smoking status: Never   Smokeless tobacco: Never  Vaping Use   Vaping Use: Never used  Substance Use Topics   Alcohol use: Not Currently   Drug use: Never    Allergies: No Known Allergies  Medications Prior to Admission  Medication Sig Dispense Refill Last Dose   ibuprofen (ADVIL) 600 MG tablet Take 1 tablet by mouth every 6 hours as needed. 30 tablet 0 04/22/2021   levothyroxine (SYNTHROID) 75 MCG  tablet Take 1 tablet by mouth on an empty stomach in the morning 90 tablet 3 04/22/2021   OMEGA-3-ACID ETHYL ESTERS PO Take 2 g by mouth daily.   04/21/2021   Prenatal Vit-Fe Fumarate-FA (PRENATAL MULTIVITAMIN) TABS tablet Take 3 tablets by mouth daily at 12 noon.   04/21/2021   sertraline (ZOLOFT) 100 MG tablet Take 1 tablet by mouth once a day 90 tablet 1 04/21/2021   acetaminophen (TYLENOL) 325 MG tablet Take 2 tablets (650 mg total) by mouth every 6 (six) hours as needed for moderate pain or mild pain (for pain scale < 4). 30 tablet 0 More than a month   calcium carbonate (TUMS - DOSED IN MG ELEMENTAL CALCIUM) 500 MG chewable tablet Chew 1-2 tablets by mouth daily as needed for indigestion or heartburn.   More than a month   famotidine (PEPCID) 20 MG tablet Take 20-40 mg by mouth daily as needed for heartburn.   More than a month   NONFORMULARY OR COMPOUNDED ITEM All Purpose  Nipple Ointment 30 each 1     Review of Systems  Gastrointestinal:  Negative for abdominal pain, constipation, diarrhea, nausea and vomiting.  Genitourinary:  Positive for vaginal bleeding (Decreased-"light reddish and not all the time."). Negative for difficulty urinating and dysuria.  Physical Exam   Blood pressure (!) 111/59, pulse (!) 113, temperature (!) 101.9 F (38.8 C), temperature source Oral, resp. rate 20, height '5\' 6"'$  (1.676 m), weight 76.7 kg, last menstrual period 07/12/2020.  Physical Exam Constitutional:      General: She is not in acute distress.    Appearance: She is well-developed. She is not toxic-appearing.  HENT:     Head: Normocephalic and atraumatic.  Eyes:     Conjunctiva/sclera: Conjunctivae normal.  Cardiovascular:     Rate and Rhythm: Normal rate and regular rhythm.     Heart sounds: Normal heart sounds.  Pulmonary:     Effort: Pulmonary effort is normal. No respiratory distress.     Breath sounds: Normal breath sounds.  Chest:  Breasts:    Right: No bleeding, skin change or  tenderness.     Left: No bleeding, skin change or tenderness.     Comments: Breast Lactating. No apparent redness or firm/hard areas appreciated.  Abdominal:     General: Bowel sounds are normal.     Palpations: Abdomen is soft.     Tenderness: There is no abdominal tenderness. There is right CVA tenderness. There is no left CVA tenderness or guarding.  Genitourinary:    Comments: Laceration well approximated. Some areas of granulation present. No signs of infection.  Scant amt lochia.  Musculoskeletal:        General: Normal range of motion.     Cervical back: Normal range of motion.     Right lower leg: No edema.     Left lower leg: No edema.  Skin:    General: Skin is warm and dry.  Neurological:     Mental Status: She is alert and oriented to person, place, and time.  Psychiatric:        Mood and Affect: Mood normal.        Speech: Speech normal.        Behavior: Behavior normal.        Thought Content: Thought content normal.    MAU Course  Procedures Results for orders placed or performed during the hospital encounter of 04/22/21 (from the past 24 hour(s))  Urinalysis, Routine w reflex microscopic     Status: Abnormal   Collection Time: 04/22/21 10:27 PM  Result Value Ref Range   Color, Urine STRAW (A) YELLOW   APPearance CLEAR CLEAR   Specific Gravity, Urine 1.005 1.005 - 1.030   pH 6.0 5.0 - 8.0   Glucose, UA NEGATIVE NEGATIVE mg/dL   Hgb urine dipstick SMALL (A) NEGATIVE   Bilirubin Urine NEGATIVE NEGATIVE   Ketones, ur NEGATIVE NEGATIVE mg/dL   Protein, ur NEGATIVE NEGATIVE mg/dL   Nitrite NEGATIVE NEGATIVE   Leukocytes,Ua TRACE (A) NEGATIVE   RBC / HPF 0-5 0 - 5 RBC/hpf   WBC, UA 0-5 0 - 5 WBC/hpf   Bacteria, UA NONE SEEN NONE SEEN  CBC with Differential/Platelet     Status: Abnormal   Collection Time: 04/22/21 10:27 PM  Result Value Ref Range   WBC 15.7 (H) 4.0 - 10.5 K/uL   RBC 3.76 (L) 3.87 - 5.11 MIL/uL   Hemoglobin 10.7 (L) 12.0 - 15.0 g/dL   HCT  33.0 (L) 36.0 - 46.0 %  MCV 87.8 80.0 - 100.0 fL   MCH 28.5 26.0 - 34.0 pg   MCHC 32.4 30.0 - 36.0 g/dL   RDW 13.5 11.5 - 15.5 %   Platelets 377 150 - 400 K/uL   nRBC 0.0 0.0 - 0.2 %   Neutrophils Relative % 82 %   Neutro Abs 12.8 (H) 1.7 - 7.7 K/uL   Lymphocytes Relative 8 %   Lymphs Abs 1.3 0.7 - 4.0 K/uL   Monocytes Relative 9 %   Monocytes Absolute 1.4 (H) 0.1 - 1.0 K/uL   Eosinophils Relative 0 %   Eosinophils Absolute 0.0 0.0 - 0.5 K/uL   Basophils Relative 0 %   Basophils Absolute 0.1 0.0 - 0.1 K/uL   Immature Granulocytes 1 %   Abs Immature Granulocytes 0.21 (H) 0.00 - 0.07 K/uL  Comprehensive metabolic panel     Status: Abnormal   Collection Time: 04/22/21 10:27 PM  Result Value Ref Range   Sodium 136 135 - 145 mmol/L   Potassium 3.7 3.5 - 5.1 mmol/L   Chloride 103 98 - 111 mmol/L   CO2 25 22 - 32 mmol/L   Glucose, Bld 96 70 - 99 mg/dL   BUN 18 6 - 20 mg/dL   Creatinine, Ser 0.76 0.44 - 1.00 mg/dL   Calcium 8.9 8.9 - 10.3 mg/dL   Total Protein 6.3 (L) 6.5 - 8.1 g/dL   Albumin 3.1 (L) 3.5 - 5.0 g/dL   AST 48 (H) 15 - 41 U/L   ALT 27 0 - 44 U/L   Alkaline Phosphatase 90 38 - 126 U/L   Total Bilirubin 0.6 0.3 - 1.2 mg/dL   GFR, Estimated >60 >60 mL/min   Anion gap 8 5 - 15   US RENAL  Result Date: 04/22/2021 CLINICAL DATA:  Right flank pain. EXAM: RENAL / URINARY TRACT ULTRASOUND COMPLETE COMPARISON:  November 17, 2020 FINDINGS: Right Kidney: Renal measurements: 12.4 cm x 5.2 cm x 5.8 cm = volume: 195.9 mL. Echogenicity within normal limits. No mass or hydronephrosis visualized. Left Kidney: Renal measurements: 13.4 cm x 6.9 cm x 5.3 cm = volume: 256.7 mL. Echogenicity within normal limits. No mass or hydronephrosis visualized. Bladder: Appears normal for degree of bladder distention. Bilateral ureteral jets are visualized. Other: None. IMPRESSION: Normal renal ultrasound. Electronically Signed   By: Virgina Norfolk M.D.   On: 04/22/2021 23:34    MDM Labs: UA, UC,  CBC/D, CMP Start IV with Fluids Analgesic Ultrasound-Renal/Pelvic Assessment and Plan  40 year old 16 Days Postpartum Fever Tachycardia Right Flank Pain  -POC reviewed. -Start IV give LR Bolus. -Labs as above. -Give Tylenol  -Will send for Renal US and await results.  Maryann Conners 04/22/2021, 9:54 PM   Reassessment (12:38 AM) Labs and results return as above. Dr. Sheryn Bison consulted and informed of patient status, evaluation, interventions, and results. Advised: *Admit for suspected endometritis. *Assess perineum for obvious s/s of infection.  *Give additional liter fluid f/b continuous 125 infusion. *Blood cultures *Amp/Gent/Clindamycin  *Pelvic US to confirm no RPOC. -Provider to bedside and updated on POC. -Vaginal exam performed. -Plan to transfer as appropriate.  Maryann Conners MSN, CNM Advanced Practice Provider, Center for [employer] [facility]

## 2021-04-22 NOTE — MAU Note (Signed)
PT SAYS SHE DEL ON  WED 8-10- VAG WENT HOME ON Friday 8-12 AFTER DEL- HAD BLOOD TRANSFUSION X2 AND PLASMA H/A STARTED AT 715PM-YESTERDAY- TOOK '400MG'$  IBUPROFEN  AT SAME TIME- NO RELIEF TODAY - DULL H/A- 400 MG IBUPROFEN AT 930AM- BUT NOTHING THIS AFTERNOON  HAD FEVER IN HOSPITAL WITH TRANSFUSION- YESTERDAY AT 8 PM- TEMP WAS 104- TOOK 200 MG MORE OF IBUPROFEN THEN AT 9PM- TEMP-103.  TODAY - 98.7 AT 10AM- TOOK '400MG'$       IBUPROFEN               99.2 AT 3PM- NO MEDS               100.2 AT 6PM- NO MEDS

## 2021-04-23 ENCOUNTER — Inpatient Hospital Stay (HOSPITAL_COMMUNITY): Payer: 59

## 2021-04-23 ENCOUNTER — Encounter (HOSPITAL_COMMUNITY): Payer: Self-pay | Admitting: Obstetrics and Gynecology

## 2021-04-23 DIAGNOSIS — O8612 Endometritis following delivery: Secondary | ICD-10-CM | POA: Diagnosis present

## 2021-04-23 DIAGNOSIS — O864 Pyrexia of unknown origin following delivery: Secondary | ICD-10-CM | POA: Diagnosis present

## 2021-04-23 DIAGNOSIS — F429 Obsessive-compulsive disorder, unspecified: Secondary | ICD-10-CM | POA: Diagnosis present

## 2021-04-23 DIAGNOSIS — F99 Mental disorder, not otherwise specified: Secondary | ICD-10-CM | POA: Diagnosis present

## 2021-04-23 LAB — COMPREHENSIVE METABOLIC PANEL
ALT: 26 U/L (ref 0–44)
AST: 39 U/L (ref 15–41)
Albumin: 2.6 g/dL — ABNORMAL LOW (ref 3.5–5.0)
Alkaline Phosphatase: 83 U/L (ref 38–126)
Anion gap: 8 (ref 5–15)
BUN: 15 mg/dL (ref 6–20)
CO2: 20 mmol/L — ABNORMAL LOW (ref 22–32)
Calcium: 8.4 mg/dL — ABNORMAL LOW (ref 8.9–10.3)
Chloride: 107 mmol/L (ref 98–111)
Creatinine, Ser: 0.75 mg/dL (ref 0.44–1.00)
GFR, Estimated: >60 mL/min (ref >60)
Glucose, Bld: 154 mg/dL — ABNORMAL HIGH (ref 70–99)
Potassium: 3.4 mmol/L — ABNORMAL LOW (ref 3.5–5.1)
Sodium: 135 mmol/L (ref 135–145)
Total Bilirubin: 0.6 mg/dL (ref 0.3–1.2)
Total Protein: 5.4 g/dL — ABNORMAL LOW (ref 6.5–8.1)

## 2021-04-23 LAB — CBC WITH DIFFERENTIAL/PLATELET
Abs Immature Granulocytes: 0.2 10*3/uL — ABNORMAL HIGH (ref 0.00–0.07)
Basophils Absolute: 0.1 10*3/uL (ref 0.0–0.1)
Basophils Relative: 0 %
Eosinophils Absolute: 0 10*3/uL (ref 0.0–0.5)
Eosinophils Relative: 0 %
HCT: 28.8 % — ABNORMAL LOW (ref 36.0–46.0)
Hemoglobin: 9.7 g/dL — ABNORMAL LOW (ref 12.0–15.0)
Immature Granulocytes: 1 %
Lymphocytes Relative: 7 %
Lymphs Abs: 1 10*3/uL (ref 0.7–4.0)
MCH: 29.2 pg (ref 26.0–34.0)
MCHC: 33.7 g/dL (ref 30.0–36.0)
MCV: 86.7 fL (ref 80.0–100.0)
Monocytes Absolute: 1.6 10*3/uL — ABNORMAL HIGH (ref 0.1–1.0)
Monocytes Relative: 11 %
Neutro Abs: 11.5 10*3/uL — ABNORMAL HIGH (ref 1.7–7.7)
Neutrophils Relative %: 81 %
Platelets: 329 10*3/uL (ref 150–400)
RBC: 3.32 MIL/uL — ABNORMAL LOW (ref 3.87–5.11)
RDW: 13.5 % (ref 11.5–15.5)
WBC: 14.3 10*3/uL — ABNORMAL HIGH (ref 4.0–10.5)
nRBC: 0 % (ref 0.0–0.2)

## 2021-04-23 MED ORDER — ZOLPIDEM TARTRATE 5 MG PO TABS: 5.0000 mg | ORAL_TABLET | Freq: Every evening | ORAL | Status: DC | PRN

## 2021-04-23 MED ORDER — DIBUCAINE (PERIANAL) 1 % EX OINT: 1.0000 "application " | TOPICAL_OINTMENT | CUTANEOUS | Status: DC | PRN

## 2021-04-23 MED ORDER — WITCH HAZEL-GLYCERIN EX PADS: 1.0000 "application " | MEDICATED_PAD | CUTANEOUS | Status: DC | PRN

## 2021-04-23 MED ORDER — ONDANSETRON HCL 4 MG PO TABS: 4.0000 mg | ORAL_TABLET | ORAL | Status: DC | PRN

## 2021-04-23 MED ORDER — LACTATED RINGERS IV BOLUS
1000.0000 mL | Freq: Once | INTRAVENOUS | Status: AC
  Administered 2021-04-23: 1000 mL via INTRAVENOUS

## 2021-04-23 MED ORDER — ENOXAPARIN SODIUM 40 MG/0.4ML IJ SOSY
40.0000 mg | PREFILLED_SYRINGE | INTRAMUSCULAR | Status: DC
  Administered 2021-04-23 – 2021-04-26 (×4): 40 mg via SUBCUTANEOUS
  Filled 2021-04-23 (×4): qty 0.4

## 2021-04-23 MED ORDER — GENTAMICIN SULFATE 40 MG/ML IJ SOLN
5.0000 mg/kg | INTRAVENOUS | Status: DC
  Administered 2021-04-23 – 2021-04-25 (×3): 380 mg via INTRAVENOUS
  Filled 2021-04-23 (×3): qty 9.5

## 2021-04-23 MED ORDER — DIPHENHYDRAMINE HCL 25 MG PO CAPS: 25.0000 mg | ORAL_CAPSULE | Freq: Four times a day (QID) | ORAL | Status: DC | PRN

## 2021-04-23 MED ORDER — OXYCODONE HCL 5 MG PO TABS: 5.0000 mg | ORAL_TABLET | ORAL | Status: DC | PRN

## 2021-04-23 MED ORDER — LEVOTHYROXINE SODIUM 25 MCG PO TABS
75.0000 ug | ORAL_TABLET | Freq: Every day | ORAL | Status: DC
  Administered 2021-04-24 – 2021-04-26 (×3): 75 ug via ORAL
  Filled 2021-04-23 (×3): qty 3

## 2021-04-23 MED ORDER — GENTAMICIN SULFATE 40 MG/ML IJ SOLN
7.0000 mg/kg | INTRAVENOUS | Status: DC
  Filled 2021-04-23: qty 11.5

## 2021-04-23 MED ORDER — ONDANSETRON HCL 4 MG/2ML IJ SOLN: 4.0000 mg | INTRAMUSCULAR | Status: DC | PRN

## 2021-04-23 MED ORDER — SERTRALINE HCL 50 MG PO TABS: 100.0000 mg | ORAL_TABLET | Freq: Every day | ORAL | Status: DC

## 2021-04-23 MED ORDER — LACTATED RINGERS IV SOLN: INTRAVENOUS | Status: DC

## 2021-04-23 MED ORDER — SERTRALINE HCL 50 MG PO TABS
100.0000 mg | ORAL_TABLET | Freq: Every day | ORAL | Status: DC
  Administered 2021-04-23 – 2021-04-25 (×3): 100 mg via ORAL
  Filled 2021-04-23 (×4): qty 2

## 2021-04-23 MED ORDER — IBUPROFEN 600 MG PO TABS
600.0000 mg | ORAL_TABLET | Freq: Four times a day (QID) | ORAL | Status: DC | PRN
  Administered 2021-04-23 – 2021-04-25 (×3): 600 mg via ORAL
  Filled 2021-04-23 (×3): qty 1

## 2021-04-23 MED ORDER — POTASSIUM CHLORIDE CRYS ER 20 MEQ PO TBCR
20.0000 meq | EXTENDED_RELEASE_TABLET | Freq: Two times a day (BID) | ORAL | Status: AC
  Administered 2021-04-23 – 2021-04-25 (×5): 20 meq via ORAL
  Filled 2021-04-23 (×5): qty 1

## 2021-04-23 MED ORDER — SODIUM CHLORIDE 0.9 % IV SOLN
2.0000 g | Freq: Four times a day (QID) | INTRAVENOUS | Status: DC
  Administered 2021-04-23 – 2021-04-25 (×7): 2 g via INTRAVENOUS
  Filled 2021-04-23 (×7): qty 2000

## 2021-04-23 MED ORDER — ACETAMINOPHEN 325 MG PO TABS
650.0000 mg | ORAL_TABLET | ORAL | Status: DC | PRN
  Administered 2021-04-23 – 2021-04-25 (×3): 650 mg via ORAL
  Filled 2021-04-23 (×3): qty 2

## 2021-04-23 MED ORDER — PRENATAL MULTIVITAMIN CH
1.0000 | ORAL_TABLET | Freq: Every day | ORAL | Status: DC
  Administered 2021-04-23 – 2021-04-25 (×3): 1 via ORAL
  Filled 2021-04-23 (×3): qty 1

## 2021-04-23 MED ORDER — SIMETHICONE 80 MG PO CHEW: 80.0000 mg | CHEWABLE_TABLET | ORAL | Status: DC | PRN

## 2021-04-23 MED ORDER — BENZOCAINE-MENTHOL 20-0.5 % EX AERO: 1.0000 "application " | INHALATION_SPRAY | CUTANEOUS | Status: DC | PRN

## 2021-04-23 MED ORDER — COCONUT OIL OIL: 1.0000 "application " | TOPICAL_OIL | Status: DC | PRN

## 2021-04-23 MED ORDER — CLINDAMYCIN PHOSPHATE 900 MG/50ML IV SOLN
900.0000 mg | Freq: Three times a day (TID) | INTRAVENOUS | Status: DC
  Administered 2021-04-23 – 2021-04-26 (×10): 900 mg via INTRAVENOUS
  Filled 2021-04-23 (×10): qty 50

## 2021-04-23 NOTE — Progress Notes (Signed)
Gynecology Progress Note  Admission Date: 04/22/2021 Current Date: 04/23/2021 4:40 PM  Sarah Estrada is a 40 y.o. G1P1001 HD#2 admitted for FUO. Patient presented to MAU with HA and fevers and is most recently s/p 8/10 SVD/2nd & midline epis with PPH (2373m, atony). Preg c/b IOL for fetal anomalies, hypothy, AMA,chronic elevated LFTs.  Patient is GBS negative  History complicated by: Patient Active Problem List   Diagnosis Date Noted   Postpartum endometritis 04/23/2021   Postpartum hemorrhage 04/06/2021   [redacted] weeks gestation of pregnancy 04/04/2021   Abnormal fetal ultrasound 04/04/2021   Marginal insertion of umbilical cord affecting management of mother 11/23/2020   Elevated LFTs 11/14/2020   Supervision of high risk primigravida of advanced maternal age, antepartum 10/15/2020   Hypothyroidism affecting pregnancy 10/15/2020   Obsessive-compulsive disorder 10/15/2020    ROS and patient/family/surgical history, located on admission H&P note dated 04/22/2021, have been reviewed, and there are no changes except as noted below Yesterday/Overnight Events:  none  Subjective:  Pt feeling much better  Objective:   Patient Vitals for the past 24 hrs:  BP Temp Temp src Pulse Resp SpO2 Height Weight  04/23/21 0806 (!) 91/46 98.2 F (36.8 C) Oral 67 17 100 % -- --  04/23/21 0128 (!) 91/48 99.4 F (37.4 C) Oral -- 18 100 % -- --  04/23/21 0017 (!) 109/59 (!) 102.7 F (39.3 C) Oral (!) 108 20 -- -- --  04/22/21 2103 (!) 111/59 (!) 101.9 F (38.8 C) Oral (!) 113 20 -- '5\' 6"'$  (1.676 m) 76.7 kg    Physical exam: General appearance: alert, cooperative, and appears stated age Abdomen: soft, non-tender; bowel sounds normal; no masses,  no organomegaly Lungs: clear to auscultation bilaterally Heart: S1, S2 normal, no murmur, rub or gallop, regular rate and rhythm Extremities: no lower extremity edema Skin: normal Psych: appropriate Neurologic: Grossly normal  Medications Current  Facility-Administered Medications  Medication Dose Route Frequency Provider Last Rate Last Admin   acetaminophen (TYLENOL) tablet 650 mg  650 mg Oral Q4H PRN PAletha Halim MD   650 mg at 04/23/21 1635   benzocaine-Menthol (DERMOPLAST) 20-0.5 % topical spray 1 application  1 application Topical PRN PAletha Halim MD       clindamycin (CLEOCIN) IVPB 900 mg  900 mg Intravenous Q8H PAletha Halim MD   Stopped at 04/23/21 1154   coconut oil  1 application Topical PRN PAletha Halim MD       witch hazel-glycerin (TUCKS) pad 1 application  1 application Topical PRN PAletha Halim MD       And   dibucaine (NUPERCAINAL) 1 % rectal ointment 1 application  1 application Rectal PRN PAletha Halim MD       diphenhydrAMINE (BENADRYL) capsule 25 mg  25 mg Oral Q6H PRN PAletha Halim MD       enoxaparin (LOVENOX) injection 40 mg  40 mg Subcutaneous Q24H PAletha Halim MD   40 mg at 04/23/21 1119   gentamicin (GARAMYCIN) 380 mg in dextrose 5 % 100 mL IVPB  5 mg/kg Intravenous Q24H PAletha Halim MD 109.5 mL/hr at 04/23/21 0352 380 mg at 04/23/21 0352   ibuprofen (ADVIL) tablet 600 mg  600 mg Oral Q6H PRN PAletha Halim MD   600 mg at 04/23/21 1635   ondansetron (ZOFRAN) tablet 4 mg  4 mg Oral Q4H PRN PAletha Halim MD       Or   ondansetron (ZOFRAN) injection 4 mg  4 mg Intravenous Q4H PRN PAletha Halim MD  oxyCODONE (Oxy IR/ROXICODONE) immediate release tablet 5 mg  5 mg Oral Q4H PRN Aletha Halim, MD       potassium chloride SA (KLOR-CON) CR tablet 20 mEq  20 mEq Oral BID Aletha Halim, MD   20 mEq at 04/23/21 1119   prenatal multivitamin tablet 1 tablet  1 tablet Oral Q1200 Aletha Halim, MD   1 tablet at 04/23/21 1119   simethicone (MYLICON) chewable tablet 80 mg  80 mg Oral PRN Aletha Halim, MD       zolpidem (AMBIEN) tablet 5 mg  5 mg Oral QHS PRN Aletha Halim, MD          Labs  Recent Labs  Lab 04/22/21 2227 04/23/21 0104  WBC 15.7*  14.3*  HGB 10.7* 9.7*  HCT 33.0* 28.8*  PLT 377 329    Recent Labs  Lab 04/22/21 2227 04/23/21 0104  NA 136 135  K 3.7 3.4*  CL 103 107  CO2 25 20*  BUN 18 15  CREATININE 0.76 0.75  CALCIUM 8.9 8.4*  PROT 6.3* 5.4*  BILITOT 0.6 0.6  ALKPHOS 90 83  ALT 27 26  AST 48* 39  GLUCOSE 96 154*   COVID swab: n/a due to recent testing and s/s; neg on 8/9 Radiology Narrative & Impression  CLINICAL DATA:  Fever, status post vaginal delivery on 08/10   EXAM: TRANSABDOMINAL ULTRASOUND OF PELVIS   TECHNIQUE: Transabdominal ultrasound examination of the pelvis was performed including evaluation of the uterus, ovaries, adnexal regions, and pelvic cul-de-sac.   COMPARISON:  None.   FINDINGS: Uterus   Measurements: 14.0 x 6.6 x 9.2 cm = volume: 442 mL. No fibroids or other mass visualized. 6 mm dystrophic calcification in the left uterine myometrium.   Endometrium   Thickness: 4 mm. No focal abnormality visualized. Trace fluid in the endometrial fundus.   Right ovary   Not discretely visualized.  No adnexal mass is seen.   Left ovary   Not discretely visualized.  No adnexal mass is seen.   Other findings:  No abnormal free fluid.   IMPRESSION: Endometrial complex measures 4 mm. No findings suspicious for retained products of conception.   Bilateral ovaries are not discretely visualized. No adnexal mass is seen.     Electronically Signed   By: Julian Hy M.D.   On: 04/23/2021 02:06   Narrative & Impression  CLINICAL DATA:  Right flank pain.   EXAM: RENAL / URINARY TRACT ULTRASOUND COMPLETE   COMPARISON:  November 17, 2020   FINDINGS: Right Kidney:   Renal measurements: 12.4 cm x 5.2 cm x 5.8 cm = volume: 195.9 mL. Echogenicity within normal limits. No mass or hydronephrosis visualized.   Left Kidney:   Renal measurements: 13.4 cm x 6.9 cm x 5.3 cm = volume: 256.7 mL. Echogenicity within normal limits. No mass or hydronephrosis visualized.    Bladder:   Appears normal for degree of bladder distention. Bilateral ureteral jets are visualized.   Other:   None.   IMPRESSION: Normal renal ultrasound.     Electronically Signed   By: Virgina Norfolk M.D.   On: 04/22/2021 23:34   Assessment & Plan:  Pt stable *PP: f/u lactation services; pt brestfeeing *FUO: continue gent and clinda (both D#2) and f/u blood and urine cultures. Etiology unknown and continue with working dx of endometritis *OCD: home zoloft ordered *Pain: no issues *FEN/GI: regular diet, SLIV, kdur ordered *PPx: lovenox *Dispo: d/w her recommend 24-48h of being afebrile before consideration of d/c  to home  Code Status: Full Code  Total time taking care of the patient was 15 minutes, with greater than 50% of the time spent in face to face interaction with the patient.  Durene Romans MD Attending Center for [facility])

## 2021-04-23 NOTE — Lactation Note (Signed)
Lactation Consultation Note  Patient Name: Sarah Estrada [id number] Date: 04/23/2021   Age:40 y.o.  Mother of 22 day old infant readmit requested #21 flanges.   Mother resting when [facility] entered room but was spoke with [facility] and stated nipples are healing well.    Vivianne Master [facility] 04/23/2021, 10:55 AM

## 2021-04-23 NOTE — Progress Notes (Addendum)
ANTIBIOTIC CONSULT NOTE - INITIAL  Pharmacy Consult for Gentamicin Indication: Postpartum endometritis  No Known Allergies  Patient Measurements: Height: '5\' 6"'$  (167.6 cm) Weight: 76.7 kg (169 lb 3.2 oz) IBW/kg (Calculated) : 59.3  Vital Signs: Temp: 99.4 F (37.4 C) (08/27 0128) Temp Source: Oral (08/27 0128) BP: 91/48 (08/27 0128) Pulse Rate: 108 (08/27 0017)  Labs: Recent Labs    04/22/21 2227 04/23/21 0104  WBC 15.7* 14.3*  HGB 10.7* 9.7*  PLT 377 329  CREATININE 0.76  --       Microbiology: Recent Results (from the past 720 hour(s))  Culture, beta strep (group b only)     Status: None   Collection Time: 03/28/21 10:04 AM   Specimen: Vaginal/Rectal; Genital   VR  Result Value Ref Range Status   Strep Gp B Culture Negative Negative Final    Comment: Centers for Disease Control and Prevention (CDC) and American Congress of Obstetricians and Gynecologists (ACOG) guidelines for prevention of perinatal group B streptococcal (GBS) disease specify co-collection of a vaginal and rectal swab specimen to maximize sensitivity of GBS detection. Per the CDC and ACOG, swabbing both the lower vagina and rectum substantially increases the yield of detection compared with sampling the vagina alone. Penicillin G, ampicillin, or cefazolin are indicated for intrapartum prophylaxis of perinatal GBS colonization. Reflex susceptibility testing should be performed prior to use of clindamycin only on GBS isolates from penicillin-allergic women who are considered a high risk for anaphylaxis. Treatment with vancomycin without additional testing is warranted if resistance to clindamycin is noted.   SARS CORONAVIRUS 2 (TAT 6-24 HRS) Nasopharyngeal Nasopharyngeal Swab     Status: None   Collection Time: 04/05/21  3:25 PM   Specimen: Nasopharyngeal Swab  Result Value Ref Range Status   SARS Coronavirus 2 NEGATIVE NEGATIVE Final    Comment: (NOTE) SARS-CoV-2 target nucleic acids are NOT  DETECTED.  The SARS-CoV-2 RNA is generally detectable in upper and lower respiratory specimens during the acute phase of infection. Negative results do not preclude SARS-CoV-2 infection, do not rule out co-infections with other pathogens, and should not be used as the sole basis for treatment or other patient management decisions. Negative results must be combined with clinical observations, patient history, and epidemiological information. The expected result is Negative.  Fact Sheet for Patients: SugarRoll.be  Fact Sheet for Healthcare Providers: https://www.woods-mathews.com/  This test is not yet approved or cleared by the [place] FDA and  has been authorized for detection and/or diagnosis of SARS-CoV-2 by FDA under an Emergency Use Authorization (EUA). This EUA will remain  in effect (meaning this test can be used) for the duration of the COVID-19 declaration under Se ction 564(b)(1) of the Act, 21 U.S.C. section 360bbb-3(b)(1), unless the authorization is terminated or revoked sooner.  Performed at [facility], [address], [place], [place] [address]     Medications:  Clindamycin 8/27> Gentamicin 8/27>  Assessment: 40 y.o. female G1P0 presenting with headache and fever s/p SVD on 04/06/21   Plan:    Gentamicin '5mg'$ /kg mg IV every 24 hrs  Will check gentamicin levels if continued > 72hr or clinically indicated. F/U Blood Cx  Beryle Lathe 04/23/2021,1:41 AM

## 2021-04-24 LAB — CBC
HCT: 27.2 % — ABNORMAL LOW (ref 36.0–46.0)
Hemoglobin: 8.7 g/dL — ABNORMAL LOW (ref 12.0–15.0)
MCH: 28.3 pg (ref 26.0–34.0)
MCHC: 32 g/dL (ref 30.0–36.0)
MCV: 88.6 fL (ref 80.0–100.0)
Platelets: 289 10*3/uL (ref 150–400)
RBC: 3.07 MIL/uL — ABNORMAL LOW (ref 3.87–5.11)
RDW: 13.7 % (ref 11.5–15.5)
WBC: 12.3 10*3/uL — ABNORMAL HIGH (ref 4.0–10.5)
nRBC: 0 % (ref 0.0–0.2)

## 2021-04-24 NOTE — Progress Notes (Signed)
HD3 admitted for FUO. Patient presented to MAU with HA and fevers and is most recently s/p 8/10 SVD/2nd & midline epis with PPH (2347m, atony). Preg c/b IOL for fetal anomalies, hypothy, AMA,chronic elevated LFTs.   Patient is GBS negative Subjective: no complaints, up ad lib, voiding, tolerating PO, and she notes improvement in her symptoms of fevers and chills overnight. Her headache has reduced. She denies neck stiffness.   Objective: Blood pressure (!) 103/57, pulse 76, temperature 98.6 F (37 C), temperature source Oral, resp. rate 17, height '5\' 6"'$  (1.676 m), weight 76.7 kg, last menstrual period 07/12/2020, SpO2 100 %.  Physical Exam:  General: alert and no distress Lochia: appropriate Uterine Fundus: firm DVT Evaluation: No evidence of DVT seen on physical exam. No cords or calf tenderness.  Recent Labs    04/23/21 0104 04/24/21 0418  HGB 9.7* 8.7*  HCT 28.8* 27.2*    Assessment/Plan: Pt stable *PP: f/u lactation services; pt brestfeeding *FUO: - Added ampicillin overnight due to persistent fever to 102.7. She feels much improved since that was initiated. f/u blood and urine cultures. Etiology unknown and continue with working dx of endometritis. WBC continues to drop with ABX -- 15.7 to 12.3 today.  *OCD: home zoloft ordered *Pain: no issues *FEN/GI: regular diet, SLIV *PPx: lovenox *Dispo: d/w her recommend 24-48h of being afebrile before consideration of d/c to home   Code Status: Full Code   LOS: 1 day   PRadene Gunning8/28/2022, 7:46 AM

## 2021-04-25 ENCOUNTER — Encounter (HOSPITAL_COMMUNITY): Payer: Self-pay | Admitting: Obstetrics and Gynecology

## 2021-04-25 DIAGNOSIS — O8612 Endometritis following delivery: Secondary | ICD-10-CM | POA: Diagnosis not present

## 2021-04-25 LAB — CBC WITH DIFFERENTIAL/PLATELET
Abs Immature Granulocytes: 0.23 10*3/uL — ABNORMAL HIGH (ref 0.00–0.07)
Basophils Absolute: 0.1 10*3/uL (ref 0.0–0.1)
Basophils Relative: 1 %
Eosinophils Absolute: 0.1 10*3/uL (ref 0.0–0.5)
Eosinophils Relative: 1 %
HCT: 27.6 % — ABNORMAL LOW (ref 36.0–46.0)
Hemoglobin: 9 g/dL — ABNORMAL LOW (ref 12.0–15.0)
Immature Granulocytes: 2 %
Lymphocytes Relative: 17 %
Lymphs Abs: 1.6 10*3/uL (ref 0.7–4.0)
MCH: 28.1 pg (ref 26.0–34.0)
MCHC: 32.6 g/dL (ref 30.0–36.0)
MCV: 86.3 fL (ref 80.0–100.0)
Monocytes Absolute: 1.2 10*3/uL — ABNORMAL HIGH (ref 0.1–1.0)
Monocytes Relative: 13 %
Neutro Abs: 6.2 10*3/uL (ref 1.7–7.7)
Neutrophils Relative %: 66 %
Platelets: 331 10*3/uL (ref 150–400)
RBC: 3.2 MIL/uL — ABNORMAL LOW (ref 3.87–5.11)
RDW: 13.8 % (ref 11.5–15.5)
WBC: 9.4 10*3/uL (ref 4.0–10.5)
nRBC: 0 % (ref 0.0–0.2)

## 2021-04-25 LAB — CULTURE, OB URINE: Culture: 60000 — AB

## 2021-04-25 MED ORDER — SODIUM CHLORIDE 0.9 % IV SOLN
INTRAVENOUS | Status: DC | PRN
  Administered 2021-04-25: 250 mL via INTRAVENOUS

## 2021-04-25 MED ORDER — SODIUM CHLORIDE 0.9 % IV SOLN
2.0000 g | INTRAVENOUS | Status: DC
  Administered 2021-04-25: 2 g via INTRAVENOUS
  Filled 2021-04-25 (×2): qty 20

## 2021-04-25 NOTE — Progress Notes (Signed)
Subjective: HD 3 admitted for FUO, endometritis PP. Patient presented to MAU with HA and fevers and is most recently s/p 8/10 SVD/2nd & midline epis with PPH (2326m, atony). Preg c/b IOL for fetal anomalies, hypothy, AMA,chronic elevated LFTs.  Patient reports that she feels much better with no pain.    Objective: Blood pressure (!) 99/57, pulse (!) 54, temperature 98.7 F (37.1 C), temperature source Oral, resp. rate 16, height '5\' 6"'$  (1.676 m), weight 76.7 kg, last menstrual period 07/12/2020, SpO2 96 %, unknown if currently breastfeeding.  I have reviewed patient's vital signs, intake and output, medications, and labs.  General: alert, cooperative, and no distress Resp: effort normal  GI: soft, non-tender; bowel sounds normal; no masses,  no organomegaly Extremities: extremities normal, atraumatic, no cyanosis or edema CBC    Component Value Date/Time   WBC 9.4 04/25/2021 0417   RBC 3.20 (L) 04/25/2021 0417   HGB 9.0 (L) 04/25/2021 0417   HGB 10.7 (L) 01/26/2021 0918   HCT 27.6 (L) 04/25/2021 0417   HCT 31.9 (L) 01/26/2021 0918   PLT 331 04/25/2021 0417   PLT 224 01/26/2021 0918   MCV 86.3 04/25/2021 0417   MCV 90 01/26/2021 0918   MCH 28.1 04/25/2021 0417   MCHC 32.6 04/25/2021 0417   RDW 13.8 04/25/2021 0417   RDW 13.4 01/26/2021 0918   LYMPHSABS 1.6 04/25/2021 0417   LYMPHSABS 1.4 10/15/2020 1117   MONOABS 1.2 (H) 04/25/2021 0417   EOSABS 0.1 04/25/2021 0417   EOSABS 0.0 10/15/2020 1117   BASOSABS 0.1 04/25/2021 0417   BASOSABS 0.0 10/15/2020 1117     Assessment/Plan: HD 4 antibiotics IV for endometritis with improvement but temp spike at 0100 today. Continue current abx    LOS: 2 days    JEmeterio Reeve8/29/2022, 10:07 AM

## 2021-04-26 ENCOUNTER — Other Ambulatory Visit (HOSPITAL_COMMUNITY): Payer: Self-pay

## 2021-04-26 DIAGNOSIS — O8612 Endometritis following delivery: Secondary | ICD-10-CM | POA: Diagnosis not present

## 2021-04-26 LAB — CBC WITH DIFFERENTIAL/PLATELET
Abs Immature Granulocytes: 0.4 10*3/uL — ABNORMAL HIGH (ref 0.00–0.07)
Basophils Absolute: 0.1 10*3/uL (ref 0.0–0.1)
Basophils Relative: 1 %
Eosinophils Absolute: 0.2 10*3/uL (ref 0.0–0.5)
Eosinophils Relative: 3 %
HCT: 29.1 % — ABNORMAL LOW (ref 36.0–46.0)
Hemoglobin: 9.7 g/dL — ABNORMAL LOW (ref 12.0–15.0)
Immature Granulocytes: 6 %
Lymphocytes Relative: 25 %
Lymphs Abs: 1.8 10*3/uL (ref 0.7–4.0)
MCH: 28.7 pg (ref 26.0–34.0)
MCHC: 33.3 g/dL (ref 30.0–36.0)
MCV: 86.1 fL (ref 80.0–100.0)
Monocytes Absolute: 0.8 10*3/uL (ref 0.1–1.0)
Monocytes Relative: 12 %
Neutro Abs: 3.8 10*3/uL (ref 1.7–7.7)
Neutrophils Relative %: 53 %
Platelets: 319 10*3/uL (ref 150–400)
RBC: 3.38 MIL/uL — ABNORMAL LOW (ref 3.87–5.11)
RDW: 14 % (ref 11.5–15.5)
WBC: 7.2 10*3/uL (ref 4.0–10.5)
nRBC: 0 % (ref 0.0–0.2)

## 2021-04-26 MED ORDER — CEPHALEXIN 500 MG PO CAPS
500.0000 mg | ORAL_CAPSULE | Freq: Four times a day (QID) | ORAL | 0 refills | Status: DC
  Filled 2021-04-26: qty 20, 5d supply, fill #0

## 2021-04-26 MED ORDER — SERTRALINE HCL 50 MG PO TABS: 100.0000 mg | ORAL_TABLET | Freq: Every day | ORAL | Status: DC

## 2021-04-26 NOTE — Discharge Summary (Signed)
Physician Discharge Summary  Patient ID: Sarah Estrada MRN: [id number] DOB/AGE: 38-Jun-1982 38 y.o.  Admit date: 04/22/2021 Discharge date: 04/26/2021  Admission Diagnoses:Encounter for supervision of high risk primigravida of advanced maternal age, antepartum  Elevated LFTs  Flank pain, acute - Plan: US RENAL, US RENAL  Retained products of conception, postpartum - Plan: US PELVIS (TRANSABDOMINAL ONLY), US PELVIS (TRANSABDOMINAL ONLY), CANCELED: US PELVIC COMPLETE WITH TRANSVAGINAL, CANCELED: US PELVIC COMPLETE WITH TRANSVAGINAL  Postpartum endometritis - Plan: Discharge patient   Discharge Diagnoses:  Active Problems:   Postpartum endometritis   Discharged Condition: good  Hospital Course: Sarah Estrada is a 40 y.o. G1P1001 at 78 Days Postpartum who receives care at [facility].  She presents today for Headache and Fever.  She reports her headache is located in the frontal area and she describes it as "a dull throb with no real sharp pains."  Patient states she has tried ibuprofen with no relief.  She reports her last dose was at 0930 and was '400mg'$ .  She rates the headache a 2/10 and states "it is not bad."  She reports her fever "spiked last night" and was 104.  She states it has gradually improved and was not present this morning.  She reports that this evening she started "getting really chilly again" and noted her fever was 102.  She reports that she is breastfeeding and pumping with no issues other than "sore nipples."  She reports she uses "all purpose nipple ointment, which has helped."      Dr. Sheryn Bison consulted and informed of patient status, evaluation, interventions, and results. Advised: *Admit for suspected endometritis UTi was also diagnosed and antibiotics changed due to sensitivities. She was afebrile at the time of discharge today Consults:  pharmacy  Significant Diagnostic Studies: microbiology: urine culture: positive for e. coli  Treatments: antibiotics:  gentamycin, ceftriaxone, and cleocin and ampicillin  Discharge Exam: Blood pressure 104/62, pulse (!) 56, temperature (!) 97.5 F (36.4 C), temperature source Oral, resp. rate 16, height '5\' 6"'$  (1.676 m), weight 76.7 kg, last menstrual period 07/12/2020, SpO2 99 %, unknown if currently breastfeeding. General appearance: alert, cooperative, and no distress Resp: clear to auscultation bilaterally GI: soft, non-tender; bowel sounds normal; no masses,  no organomegaly Extremities: extremities normal, atraumatic, no cyanosis or edema  Disposition: Discharge disposition: 01-Home or Self Care       Discharge Instructions     Discharge patient   Complete by: As directed    Discharge disposition: 01-Home or Self Care   Discharge patient date: 04/26/2021      Allergies as of 04/26/2021   No Known Allergies      Medication List     STOP taking these medications    acetaminophen 325 MG tablet Commonly known as: Tylenol   ibuprofen 600 MG tablet Commonly known as: ADVIL       TAKE these medications    calcium carbonate 500 MG chewable tablet Commonly known as: TUMS - dosed in mg elemental calcium Chew 1-2 tablets by mouth daily as needed for indigestion or heartburn.   cephALEXin 500 MG capsule Commonly known as: Keflex Take 1 capsule (500 mg total) by mouth 4 (four) times daily.   famotidine 20 MG tablet Commonly known as: PEPCID Take 20-40 mg by mouth daily as needed for heartburn.   levothyroxine 75 MCG tablet Commonly known as: SYNTHROID Take 1 tablet by mouth on an empty stomach in the morning   NONFORMULARY OR COMPOUNDED ITEM All Purpose Nipple  Ointment   OMEGA-3-ACID ETHYL ESTERS PO Take 2 g by mouth daily.   prenatal multivitamin Tabs tablet Take 3 tablets by mouth daily at 12 noon.   sertraline 100 MG tablet Commonly known as: ZOLOFT Take 1 tablet by mouth once a day         Signed: Emeterio Reeve 04/26/2021, 9:18 AM

## 2021-04-26 NOTE — Plan of Care (Signed)

## 2021-04-28 LAB — CULTURE, BLOOD (ROUTINE X 2)
Culture: NO GROWTH
Culture: NO GROWTH
Special Requests: ADEQUATE

## 2021-04-29 ENCOUNTER — Encounter: Payer: Self-pay | Admitting: *Deleted

## 2021-04-29 ENCOUNTER — Other Ambulatory Visit: Payer: Self-pay | Admitting: *Deleted

## 2021-04-29 NOTE — Patient Outreach (Signed)
[facility]) Care Management  04/29/2021  Sarah Estrada 1981-02-08 [id number]   Transition of care call/case closure   Referral received:04/27/21 Initial outreach:04/29/21 Insurance: [place] [employer]    Subjective: Initial successful telephone call to patient's preferred number in order to complete transition of care assessment; 2 HIPAA identifiers verified. Explained purpose of call and completed transition of care assessment.  Sarah Estrada states that she is doing lots better. She denies having chills , fever aches.  She is  tolerating diet, denies bowel or bladder problems.  Patient spouse/family assisting with recovery.   Reviewed accessing the following [place] Benefits :  She does  not have the hospital indemnity She uses a [facility] outpatient pharmacy at [place].       Objective:  Sarah Estrada was hospitalized at [facility] 8/27-8/30/22 for Postpartum endometritis. PMHx:Term pregnancy delivery   She  was discharged to home on 04/26/21 without the need for home health services or DME.   Assessment:  Patient voices good understanding of all discharge instructions.  See transition of care flowsheet for assessment details.   Plan:  Reviewed hospital discharge diagnosis of Endometritis postpartum   and discharge treatment plan using hospital discharge instructions, assessing medication adherence, reviewing problems requiring provider notification, and discussing the importance of follow up with surgeon, primary care provider and/or specialists as directed.  Reviewed [place] healthy lifestyle program information to receive discounted premium for  2023   Step 1: Get  your annual physical  Step 2: Complete your health assessment  Step 3:Identify your current health status and complete the corresponding action step between August 28, 2020 and April 28, 2021.     No ongoing care management needs identified so will  close case to [place] Management services and route successful outreach letter with [place] Management pamphlet and 24 Hour Nurse Line Magnet to [place] [facility] clinical pool to be mailed to patient's home address.     Joylene Draft, RN, BSN  [place] Management Coordinator  [phone]- Mobile [phone]- Toll Free Main Office

## 2021-05-11 ENCOUNTER — Ambulatory Visit (INDEPENDENT_AMBULATORY_CARE_PROVIDER_SITE_OTHER): Payer: 59 | Admitting: Certified Nurse Midwife

## 2021-05-11 ENCOUNTER — Other Ambulatory Visit: Payer: Self-pay

## 2021-05-11 ENCOUNTER — Encounter: Payer: Self-pay | Admitting: Certified Nurse Midwife

## 2021-05-11 DIAGNOSIS — N8189 Other female genital prolapse: Secondary | ICD-10-CM

## 2021-05-11 DIAGNOSIS — D508 Other iron deficiency anemias: Secondary | ICD-10-CM | POA: Diagnosis not present

## 2021-05-11 LAB — CBC
Hematocrit: 35.1 % (ref 34.0–46.6)
Hemoglobin: 11.4 g/dL (ref 11.1–15.9)
MCH: 27.3 pg (ref 26.6–33.0)
MCHC: 32.5 g/dL (ref 31.5–35.7)
MCV: 84 fL (ref 79–97)
Platelets: 399 x10E3/uL (ref 150–450)
RBC: 4.18 x10E6/uL (ref 3.77–5.28)
RDW: 13.4 % (ref 11.7–15.4)
WBC: 4.4 x10E3/uL (ref 3.4–10.8)

## 2021-05-11 NOTE — Progress Notes (Signed)
Ms. Sarah Estrada is 5 weeks postpartum and stated that she is doing well and denies any pain or vaginal bleeding. I offered to have her stitches looked at and she accepted.   Jaleigh Mccroskey, CMA

## 2021-05-11 NOTE — Progress Notes (Signed)
Sarah Estrada is a 40 y.o. G11P1001 female who presents for a postpartum visit. She is 5 weeks postpartum following a normal spontaneous vaginal delivery.  I have fully reviewed the prenatal and intrapartum course. Delivery complicated by a right episiotomy and succal tear followed by a 2310m PPH. The delivery was at 38.2 gestational weeks.  Anesthesia: epidural. Postpartum course has been complicated by readmission for fever and treated for endometritis. Baby is doing well. Baby is feeding by both breast and bottle - Similac 360 . Bleeding no bleeding. Bowel function is normal. Bladder function is normal. Patient is not sexually active. Contraception method is none. Postpartum depression screening: negative.  The pregnancy intention screening data noted above was reviewed. Potential methods of contraception were discussed. The patient elected to proceed with Female Condom.   Edinburgh Postnatal Depression Scale - 05/11/21 0848       Edinburgh Postnatal Depression Scale:  In the Past 7 Days   I have been able to laugh and see the funny side of things. 0    I have looked forward with enjoyment to things. 0    I have blamed myself unnecessarily when things went wrong. 0    I have been anxious or worried for no good reason. 0    I have felt scared or panicky for no good reason. 0    Things have been getting on top of me. 0    I have been so unhappy that I have had difficulty sleeping. 0    I have felt sad or miserable. 0    I have been so unhappy that I have been crying. 0    The thought of harming myself has occurred to me. 0    Edinburgh Postnatal Depression Scale Total 0            Health Maintenance Due  Topic Date Due   Pneumococcal Vaccine 087656Years old (1 - PCV) Never done   COVID-19 Vaccine (2 - Pfizer risk series) 06/14/2020   INFLUENZA VACCINE  03/28/2021   The following portions of the patient's history were reviewed and updated as appropriate:  allergies, current medications, past family history, past medical history, past social history, past surgical history, and problem list.  Review of Systems Pertinent items noted in HPI and remainder of comprehensive ROS otherwise negative.  Objective:  BP 104/66   Pulse (!) 56   Wt 164 lb (74.4 kg)   Breastfeeding Yes   BMI 26.47 kg/m    General:  alert, cooperative, appears stated age, fatigued, and no distress   Breasts:  normal  Lungs: Normal effort  Heart:  regular rate and rhythm  Abdomen: Soft, non-tender    Wound N/A  GU exam:   Superficial stitch still present on right side of labia, removed easily. Superficial stitches still present above repair site of succal tear, dissolving to left alone. Triangular area of granulation tissue above succal repair, treated with silver nitrate. Pt tolerated exam, suture removal and silver nitrate well. Pelvic floor weak.         Assessment & Plan:  Postpartum exam of lactating mother  Essential components of care per ACOG recommendations:  1.  Mood and well being: Patient with negative depression screening today. Reviewed local resources for support.  - Patient tobacco use? No.   - hx of drug use? No.    2. Infant care and feeding:  -Patient currently breastmilk feeding? Yes. Discussed returning to work  and pumping. Reviewed importance of draining breast regularly to support lactation.  -Social determinants of health (SDOH) reviewed in EPIC. No concerns  3. Sexuality, contraception and birth spacing - Patient does not want a pregnancy in the next year.  Desired family size is unknown at the time. - Reviewed forms of contraception in tiered fashion. Patient desired condoms, natural family planning (NFP) today.   - Discussed birth spacing of 18 months  4. Sleep and fatigue -Encouraged family/partner/community support of 4 hrs of uninterrupted sleep to help with mood and fatigue  5. Physical Recovery  - Discussed patients delivery and  complications. She describes her labor as mixed. - Patient had a Vaginal problems after delivery including succal tear and PPH of 2387m . Patient had a  2nd degree and sulcus  laceration. Perineal healing reviewed. Patient expressed understanding - Patient has urinary incontinence? No. - Patient is safe to resume physical and sexual activity when ready, encouraged to use copious lubrication and full arousal before attempting penetration. - Referred to physical therapy for pelvic floor weakness  6.  Health Maintenance - HM due items addressed Yes - Last pap smear  Diagnosis  Date Value Ref Range Status  10/15/2020   Final   - Negative for intraepithelial lesion or malignancy (NILM)   Pap smear not done at today's visit.  -Breast Cancer screening indicated? No.   7. Chronic Disease/Pregnancy Condition follow up: Anemia - CBC drawn today - PCP follow up as needed  JGabriel Carina [place]

## 2021-05-23 ENCOUNTER — Other Ambulatory Visit: Payer: Self-pay | Admitting: Allergy and Immunology

## 2021-05-23 ENCOUNTER — Other Ambulatory Visit: Payer: Self-pay

## 2021-05-23 DIAGNOSIS — R197 Diarrhea, unspecified: Secondary | ICD-10-CM | POA: Diagnosis not present

## 2021-05-25 ENCOUNTER — Telehealth: Payer: Self-pay | Admitting: Obstetrics & Gynecology

## 2021-05-25 ENCOUNTER — Telehealth: Payer: Self-pay | Admitting: Allergy & Immunology

## 2021-05-25 DIAGNOSIS — A0472 Enterocolitis due to Clostridium difficile, not specified as recurrent: Secondary | ICD-10-CM | POA: Insufficient documentation

## 2021-05-25 LAB — CLOSTRIDIUM DIFFICILE EIA: C difficile Toxins A+B, EIA: POSITIVE — AB

## 2021-05-25 MED ORDER — VANCOMYCIN HCL 125 MG PO CAPS: 125.0000 mg | ORAL_CAPSULE | Freq: Four times a day (QID) | ORAL | 0 refills | Status: AC

## 2021-05-25 NOTE — Telephone Encounter (Signed)
     Faculty Practice OB/GYN Physician Phone Call Documentation  I had a phone conversation with Sarah Estrada about her recently diagnosed C. Difficile diarrhea.  She is being treated appropriately for this. Gave support to her especially given her complicated postpartum course, she was told about increased risk of candidal infections after so many weeks of antibiotics. She should call us if she feels she is having a yeast infection signs/symptoms. She was reassured that this antibiotic was okay with lactation.  All questions answered.   Appreciate Dr. Gillermina Hu management of this situation.  We will continue to follow along.    [name], MD, [place] for [employer], [place]

## 2021-05-25 NOTE — Telephone Encounter (Signed)
Patient has confirmed C diff colitis from her two weeks of antibiotics. I will send in vancomycin 125mg  QID for 10 days. Routing to Dr. Harolyn Rutherford to keep the OB in the loop.   Salvatore Marvel, MD [facility] [place]

## 2021-05-28 LAB — STOOL CULTURE: E coli, Shiga toxin Assay: NEGATIVE

## 2021-05-30 DIAGNOSIS — B379 Candidiasis, unspecified: Secondary | ICD-10-CM

## 2021-05-31 ENCOUNTER — Other Ambulatory Visit (HOSPITAL_COMMUNITY): Payer: Self-pay

## 2021-05-31 MED ORDER — FLUCONAZOLE 150 MG PO TABS
150.0000 mg | ORAL_TABLET | Freq: Once | ORAL | 3 refills | Status: AC
  Filled 2021-05-31: qty 1, 1d supply, fill #0
  Filled 2021-09-09: qty 1, 1d supply, fill #1

## 2021-06-08 DIAGNOSIS — R7303 Prediabetes: Secondary | ICD-10-CM | POA: Diagnosis not present

## 2021-06-08 DIAGNOSIS — Z13220 Encounter for screening for lipoid disorders: Secondary | ICD-10-CM | POA: Diagnosis not present

## 2021-06-08 DIAGNOSIS — Z79899 Other long term (current) drug therapy: Secondary | ICD-10-CM | POA: Diagnosis not present

## 2021-06-08 DIAGNOSIS — E039 Hypothyroidism, unspecified: Secondary | ICD-10-CM | POA: Diagnosis not present

## 2021-06-08 DIAGNOSIS — Z862 Personal history of diseases of the blood and blood-forming organs and certain disorders involving the immune mechanism: Secondary | ICD-10-CM | POA: Diagnosis not present

## 2021-06-08 DIAGNOSIS — Z8619 Personal history of other infectious and parasitic diseases: Secondary | ICD-10-CM | POA: Diagnosis not present

## 2021-06-08 DIAGNOSIS — E559 Vitamin D deficiency, unspecified: Secondary | ICD-10-CM | POA: Diagnosis not present

## 2021-06-08 DIAGNOSIS — F429 Obsessive-compulsive disorder, unspecified: Secondary | ICD-10-CM | POA: Diagnosis not present

## 2021-06-15 ENCOUNTER — Other Ambulatory Visit (HOSPITAL_COMMUNITY): Payer: Self-pay

## 2021-06-15 MED ORDER — SERTRALINE HCL 100 MG PO TABS
100.0000 mg | ORAL_TABLET | Freq: Every day | ORAL | 3 refills | Status: AC
  Filled 2021-06-15: qty 90, 90d supply, fill #0
  Filled 2021-09-09: qty 90, 90d supply, fill #1

## 2021-07-15 ENCOUNTER — Encounter: Payer: Self-pay | Admitting: Physical Therapy

## 2021-07-15 ENCOUNTER — Other Ambulatory Visit: Payer: Self-pay

## 2021-07-15 ENCOUNTER — Ambulatory Visit: Payer: 59 | Attending: Certified Nurse Midwife | Admitting: Physical Therapy

## 2021-07-15 DIAGNOSIS — M6281 Muscle weakness (generalized): Secondary | ICD-10-CM | POA: Insufficient documentation

## 2021-07-15 DIAGNOSIS — R279 Unspecified lack of coordination: Secondary | ICD-10-CM | POA: Diagnosis not present

## 2021-07-15 NOTE — Therapy (Signed)
[place] @ [address] [place], [place], [address] Phone: [phone]   Fax:  [phone]  Physical Therapy Evaluation  Patient Details  Name: Sarah Estrada MRN: [id number] Date of Birth: 05/20/1981 Referring Provider (PT): Gabriel Carina, [place]   Encounter Date: 07/15/2021   PT End of Session - 07/15/21 0933     Visit Number 1    Number of Visits 25    Date for PT Re-Evaluation 10/15/21    Authorization Type [employer] choice    PT Start Time 0845    PT Stop Time 0926    PT Time Calculation (min) 41 min    Activity Tolerance Patient tolerated treatment well    Behavior During Therapy [facility] for tasks assessed/performed             Past Medical History:  Diagnosis Date   Hypothyroidism    OCD (obsessive compulsive disorder)    Supervision of high risk primigravida of advanced maternal age, antepartum 10/15/2020    Nursing Staff Provider Office Location [facility] Dating  LMP Language  English Anatomy US  Normal Flu Vaccine  04/2020 Genetic Screen  NIPS: Low risk   AFP: Neg    Horizon 4: Neg TDaP Vaccine  01/26/21 Hgb A1C or  GTT Early Hg A1C 5.3 Third trimester WNL  COVID Vaccine Pfizer-All 3   LAB RESULTS  Rhogam  NA Blood Type AB/Positive/-- (02/18 1117)  Feeding Plan Breast Antibody Negative (02/18 1117) Con    Past Surgical History:  Procedure Laterality Date   WISDOM TOOTH EXTRACTION      There were no vitals filed for this visit.    Subjective Assessment - 07/15/21 0845     Subjective Pt is 14 weeks postpartum and referred for weakness at pelvic floor. Pt is having urgency and small urinary leakage noted. Pt is not sexually active currently since postpartum. Pt no longer breastfeeding. Pt reports she feels tightness and discomfort vaginally at what she thinks is her scar site.    Pertinent History vaginal birth 14 weeks ago, had succal tear and right episiotomy    How long can you sit comfortably? no limit     How long can you stand comfortably? no limit    How long can you walk comfortably? no limit    Patient Stated Goals to return to running and have improved pelvic stability without discomfort.    Currently in Pain? No/denies                [facility] PT Assessment - 07/15/21 0001       Assessment   Medical Diagnosis N81.89 (ICD-10-CM) - Pelvic floor weakness in female    Referring Provider (PT) Gabriel Carina, CNM    Onset Date/Surgical Date --   14 weeks ago   Prior Therapy no      Precautions   Precautions Other (comment)    Precaution Comments postpartum 14 weeks      Restrictions   Weight Bearing Restrictions No      Balance Screen   Has the patient fallen in the past 6 months No    Has the patient had a decrease in activity level because of a fear of falling?  No    Is the patient reluctant to leave their home because of a fear of falling?  No      Home Social worker Private residence    Living Arrangements Spouse/significant other;Children  Prior Function   Level of Independence Independent    Vocation Part time employment    Vocation Requirements CNA at [employer]   Overall Cognitive Status Within Functional Limits for tasks assessed      Sensation   Light Touch Appears Intact      Coordination   Gross Motor Movements are Fluid and Coordinated Yes    Fine Motor Movements are Fluid and Coordinated Yes      Posture/Postural Control   Posture/Postural Control Postural limitations    Postural Limitations Rounded Shoulders;Increased thoracic kyphosis;Increased lumbar lordosis;Posterior pelvic tilt      ROM / Strength   AROM / PROM / Strength AROM;Strength      AROM   Overall AROM Comments thoracic spine limited 50% in flexion and extension, thoracic and lumbar all others limited 25%      Strength   Overall Strength Comments bil hips 4/5 grossly      Flexibility   Soft Tissue Assessment /Muscle Length yes   bil  adductors limited 25%     Palpation   Palpation comment no TTP noted by pt during exam                 No emotional/communication barriers or cognitive limitation. Patient is motivated to learn. Patient understands and agrees with treatment goals and plan. PT explains patient will be examined in standing, sitting, and lying down to see how their muscles and joints work. When they are ready, they will be asked to remove their underwear so PT can examine their perineum. The patient is also given the option of providing their own chaperone as one is not provided in our facility. The patient also has the right and is explained the right to defer or refuse any part of the evaluation or treatment including the internal exam. With the patient's consent, PT will use one gloved finger to gently assess the muscles of the pelvic floor, seeing how well it contracts and relaxes and if there is muscle symmetry. After, the patient will get dressed and PT and patient will discuss exam findings and plan of care. PT and patient discuss plan of care, schedule, attendance policy and HEP activities.        Objective measurements completed on examination: See above findings.     Pelvic Floor Special Questions - 07/15/21 0001     Are you Pregnant or attempting pregnancy? No    Prior Pregnancies Yes    Number of Pregnancies 1    Number of Vaginal Deliveries 1    Any difficulty with labor and deliveries Yes   succal tearing and episiotomy   Episiotomy Performed Yes    Currently Sexually Active No   but pt did report sometimes she did have pain with intercourse prior to birth of daughter   History of sexually transmitted disease No    Marinoff Scale discomfort that does not affect completion    Urinary Leakage Yes    How often every once in a while not consistent    Pad use no    Activities that cause leaking With strong urge    Urinary urgency Yes    Urinary frequency sometimes urinating at 1-1.5  hours    Fecal incontinence No    Fluid intake a few bottles drank per day    Caffeine beverages yes-1 cup of coffe in morning    Falling out feeling (prolapse) Yes    Activities that cause feeling  of prolapse walking sometimes    External Perineal Exam WFL    Prolapse Anterior Wall   grade 1   Pelvic Floor Internal Exam patient identified and patient confirms consent for PT to perform internal soft tissue work and muscle strength and integrity assessment    Exam Type Vaginal    Sensation WFL    Palpation TTP at tear sites    Strength weak squeeze, no lift    Strength # of reps 9    Strength # of seconds 8    Tone fair                       PT Education - 07/15/21 0932     Education Details Pt educated on exam findings, female pelvic floor with model used, HEP, POC, and perineal massage    Person(s) Educated Patient    Methods Explanation;Demonstration;Tactile cues;Verbal cues;Handout    Comprehension Verbalized understanding;Returned demonstration              PT Short Term Goals - 07/15/21 1047       PT SHORT TERM GOAL #1   Title pt to be I HEP    Time 6    Period Weeks    Status New    Target Date 08/26/21      PT SHORT TERM GOAL #2   Title pt to demonstrate at least 3/5 pelvic floor strength to improve pelvic stability for return to running and decrease leakage symptoms.    Time 6    Period Weeks    Status New    Target Date 08/26/21      PT SHORT TERM GOAL #3   Title pt to demonstrate ability to lift 15# with good mechanics from ground level with improved breathing mechanics to decrease strain at pelvic floor    Time 6    Period Weeks    Status New    Target Date 08/26/21               PT Long Term Goals - 07/15/21 1100       PT LONG TERM GOAL #1   Title pt to be I advanced HEP    Time 3    Period Months    Status New    Target Date 10/15/21      PT LONG TERM GOAL #2   Title pt to demonstrate at least 4/5 pelvic floor  strength to improve pelvic stability for return to running and decrease leakage symptoms.    Time 3    Period Months    Status New    Target Date 10/15/21      PT LONG TERM GOAL #3   Title pt to demonstrate ability to lift 25# with good mechanics from ground level with improved breathing mechanics to decrease strain at pelvic floor    Time 3    Period Months    Status New    Target Date 10/15/21      PT LONG TERM GOAL #4   Title pt demonstrate 5/5 bil hip strength for improved pelvic stability for return to running    Time 3    Period Months    Status New    Target Date 10/15/21                    Plan - 07/15/21 0933     Clinical Impression Statement Pt is 40yo female presenting to clinic with referral for pelvic  floor weakness from postpartum follow up appointment. Pt is currently 14 weeks postpatum and reports some tightness noted at scar/tearing site, sometimes feels like a heaviness after long walks, has not have postpartum sexual intercourse but reports this was painful sometimes before delivery. Pt had vaginal birth with succal tearing and episiotomy at right completed, did have postpartum infections requiring medical treatment that has been completed. Pt reports she ran prior to baby and would like to get back to this as able. Pt found to have thoracic and lumbar spine decreased mobility in all directions, decreased hip mobility and strength, decreased corea ctivation mildly but able to contract TA well and grasped breathin mechanics well. Pt consented to internal assessment vaginally and found to have 2/5 pelvic floor weakness but good coordination and connection with pelvic floor noted. Pt did have mild decrease in endurance and coordination with quick flicks but not severe. Pt had grade one anterior wall prolapse as well and decreased mobility in all directions with TTP at scar site. Pt educated on POC, findings, HEP and perineal massage, denied additional questions. Pt  would benefit from additional PT to further address needs.    Personal Factors and Comorbidities Comorbidity 1    Comorbidities postpartum    Examination-Activity Limitations Continence;Hygiene/Grooming;Locomotion Level    Examination-Participation Restrictions Community Activity;Interpersonal Relationship    Stability/Clinical Decision Making Stable/Uncomplicated    Clinical Decision Making Low    Rehab Potential Good    PT Frequency 1x / week    PT Duration 12 weeks    PT Treatment/Interventions ADLs/Self Care Home Management;Aquatic Therapy;Functional mobility training;Therapeutic activities;Therapeutic exercise;Neuromuscular re-education;Manual techniques;Patient/family education;Scar mobilization;Passive range of motion;Energy conservation    PT Next Visit Plan go over handouts, perienal massage    PT Home Exercise Plan [email or web address]    [id number] and Agree with Plan of Care Patient             Patient will benefit from skilled therapeutic intervention in order to improve the following deficits and impairments:  Decreased coordination, Decreased endurance, Pain, Improper body mechanics, Impaired flexibility, Decreased mobility, Decreased strength, Postural dysfunction, Decreased scar mobility  Visit Diagnosis: Lack of coordination - Plan: PT plan of care cert/re-cert  Muscle weakness (generalized) - Plan: PT plan of care cert/re-cert     Problem List Patient Active Problem List   Diagnosis Date Noted   C. difficile diarrhea 05/25/2021   Postpartum endometritis 04/23/2021   Postpartum hemorrhage 04/06/2021   Elevated LFTs 11/14/2020   Obsessive-compulsive disorder 10/15/2020   Stacy Gardner, PT, DPT 07/15/2210:02 AM   [place] @ [address] [place], [place], [address] Phone: [phone]   Fax:  [phone]  Name: GURNOOR SLOOP MRN: [id number] Date of Birth: 1981/01/16

## 2021-07-15 NOTE — Patient Instructions (Signed)
Access Code: [email or web address] URL: [email or web address] Date: 07/15/2021 Prepared by: [facility]  Program Notes 90/90 breathing can be done without chair with feet at wall.   Exercises Diaphragmatic Breathing at 90/90 Supported - 1 x daily - 7 x weekly - 1 sets - 10 reps - 2-5s holds Supine Transversus Abdominis Bracing - Hands on Thighs - 1 x daily - 7 x weekly - 1 sets - 10 reps - 2-3s holds Diaphragmatic Breathing in Child's Pose with Pelvic Floor Relaxation - 1 x daily - 7 x weekly - 1 sets - 2-3 reps - 30-45s holds Sidelying Thoracic and Shoulder Rotation - 1 x daily - 7 x weekly - 1 sets - 2-3 reps - 30-45s holds Tall Kneeling Hip Hinge - 1 x daily - 7 x weekly - 1 sets - 10 reps - 3-5s holds Half Kneel Row - 1 x daily - 7 x weekly - 1 sets - 10 reps - 2-3s holds Supine Bridge with Pelvic Floor Contraction - 1 x daily - 7 x weekly - 1 sets - 10 reps - 3-5s holds Wall Angels - 1 x daily - 7 x weekly - 1 sets - 2-3 reps - 30-45s holds Scapular Wall Slides - 1 x daily - 7 x weekly - 1 sets - 10 reps - 5-10s holds

## 2021-07-22 DIAGNOSIS — R300 Dysuria: Secondary | ICD-10-CM | POA: Diagnosis not present

## 2021-08-06 DIAGNOSIS — R300 Dysuria: Secondary | ICD-10-CM | POA: Diagnosis not present

## 2021-08-09 ENCOUNTER — Ambulatory Visit: Payer: 59 | Attending: Certified Nurse Midwife | Admitting: Physical Therapy

## 2021-08-09 ENCOUNTER — Other Ambulatory Visit: Payer: Self-pay

## 2021-08-09 DIAGNOSIS — M6281 Muscle weakness (generalized): Secondary | ICD-10-CM | POA: Insufficient documentation

## 2021-08-09 DIAGNOSIS — R279 Unspecified lack of coordination: Secondary | ICD-10-CM | POA: Diagnosis not present

## 2021-08-09 NOTE — Therapy (Signed)
[place] @ [address] [place], [place], [address] Phone: [phone]   Fax:  [phone]  Physical Therapy Treatment  Patient Details  Name: Sarah Estrada MRN: [id number] Date of Birth: 28-May-1981 Referring Provider (PT): Gabriel Carina, [place]   Encounter Date: 08/09/2021   PT End of Session - 08/09/21 1438     Visit Number 2    Number of Visits 25    Date for PT Re-Evaluation 10/15/21    Authorization Type UMR choice    PT Start Time 1401    PT Stop Time 1442    PT Time Calculation (min) 41 min    Activity Tolerance Patient tolerated treatment well    Behavior During Therapy [facility] for tasks assessed/performed             Past Medical History:  Diagnosis Date   Hypothyroidism    OCD (obsessive compulsive disorder)    Supervision of high risk primigravida of advanced maternal age, antepartum 10/15/2020    Nursing Staff Provider Office Location [facility] Dating  LMP Language  English Anatomy US  Normal Flu Vaccine  04/2020 Genetic Screen  NIPS: Low risk   AFP: Neg    Horizon 4: Neg TDaP Vaccine  01/26/21 Hgb A1C or  GTT Early Hg A1C 5.3 Third trimester WNL  COVID Vaccine Pfizer-All 3   LAB RESULTS  Rhogam  NA Blood Type AB/Positive/-- (02/18 1117)  Feeding Plan Breast Antibody Negative (02/18 1117) Con    Past Surgical History:  Procedure Laterality Date   WISDOM TOOTH EXTRACTION      There were no vitals filed for this visit.   Subjective Assessment - 08/09/21 1403     Subjective Pt reports she feels she getting better had no pain with intercouse, improve tightness vaginally, does still have strong urges to urinate with needing to get to bathroom quickly to prevent leakage, mild leakage with not being able to get to bathroom.    Pertinent History vaginal birth 14 weeks ago, had succal tear and right episiotomy    How long can you sit comfortably? no limit    How long can you stand comfortably? no limit     Patient Stated Goals to return to running and have improved pelvic stability without discomfort.    Currently in Pain? No/denies                               [facility] Adult PT Treatment/Exercise - 08/09/21 0001       Exercises   Exercises Lumbar;Knee/Hip      Lumbar Exercises: Stretches   Other Lumbar Stretch Exercise needle threaders 2x30s each side; childs pose x30s      Lumbar Exercises: Standing   Functional Squats 20 reps    Functional Squats Limitations bodyweight with exhale and PF contraction    Forward Lunge 10 reps    Forward Lunge Limitations with exhale and PF contraction    Other Standing Lumbar Exercises palloffs green band x10; palloffs rotation green band x10      Lumbar Exercises: Seated   Other Seated Lumbar Exercises lunge x10 each leg      Lumbar Exercises: Supine   Clam 20 reps;Limitations    Clam Limitations black    Bridge 10 reps    Bridge Limitations with hip abduction    Other Supine Lumbar Exercises hip flexion black loop 2x10;  Lumbar Exercises: Quadruped   Opposite Arm/Leg Raise Right arm/Left leg;Left arm/Right leg;10 reps    Other Quadruped Lumbar Exercises donkey kick >fire hydrant black loop 2x10                     PT Education - 08/09/21 1438     Education Details Pt educated on breathing mechanics, pelvic floor coordination, and techniques with all exericses    Person(s) Educated Patient    Methods Explanation;Demonstration;Tactile cues;Verbal cues    Comprehension Verbalized understanding;Returned demonstration              PT Short Term Goals - 07/15/21 1047       PT SHORT TERM GOAL #1   Title pt to be I HEP    Time 6    Period Weeks    Status New    Target Date 08/26/21      PT SHORT TERM GOAL #2   Title pt to demonstrate at least 3/5 pelvic floor strength to improve pelvic stability for return to running and decrease leakage symptoms.    Time 6    Period Weeks    Status New     Target Date 08/26/21      PT SHORT TERM GOAL #3   Title pt to demonstrate ability to lift 15# with good mechanics from ground level with improved breathing mechanics to decrease strain at pelvic floor    Time 6    Period Weeks    Status New    Target Date 08/26/21               PT Long Term Goals - 07/15/21 1100       PT LONG TERM GOAL #1   Title pt to be I advanced HEP    Time 3    Period Months    Status New    Target Date 10/15/21      PT LONG TERM GOAL #2   Title pt to demonstrate at least 4/5 pelvic floor strength to improve pelvic stability for return to running and decrease leakage symptoms.    Time 3    Period Months    Status New    Target Date 10/15/21      PT LONG TERM GOAL #3   Title pt to demonstrate ability to lift 25# with good mechanics from ground level with improved breathing mechanics to decrease strain at pelvic floor    Time 3    Period Months    Status New    Target Date 10/15/21      PT LONG TERM GOAL #4   Title pt demonstrate 5/5 bil hip strength for improved pelvic stability for return to running    Time 3    Period Months    Status New    Target Date 10/15/21                   Plan - 08/09/21 1443     Clinical Impression Statement Pt presents to clinic reporting improvement in some symptoms of bulge, pain, and urinary incontinence. Pt continues to have urinary urgency and mild leakage with not being able to get to bathroom quickly enough. Pt reports she feels she has a better understanding on how to contract pelvic floor and when to do this, updated HEP this session and pt's exercises focused on this to insure proper technique for home. Pt demonstrated good ability to contract core but needed cues to do this with  activity, cues for breathing mechanics and technique during activities and demonstrated hip and core instability with increased activity difficulty. Pt tolerated well. Pt continues to demonstrate need of PT to address  urinary urgency, urinary incontinence, global strengthening postpartum, and return to running per pt goals.    Personal Factors and Comorbidities Comorbidity 1    Comorbidities postpartum    Examination-Activity Limitations Continence;Hygiene/Grooming;Locomotion Level    Examination-Participation Restrictions Community Activity;Interpersonal Relationship    Stability/Clinical Decision Making Stable/Uncomplicated    Rehab Potential Good    PT Frequency 1x / week    PT Duration 12 weeks    PT Treatment/Interventions ADLs/Self Care Home Management;Aquatic Therapy;Functional mobility training;Therapeutic activities;Therapeutic exercise;Neuromuscular re-education;Manual techniques;Patient/family education;Scar mobilization;Passive range of motion;Energy conservation    PT Next Visit Plan strengthening, stretching thoracic spine, and pelvic floor coordination    PT Home Exercise Plan [email or web address]    [id number] and Agree with Plan of Care Patient             Patient will benefit from skilled therapeutic intervention in order to improve the following deficits and impairments:  Decreased coordination, Decreased endurance, Pain, Improper body mechanics, Impaired flexibility, Decreased mobility, Decreased strength, Postural dysfunction, Decreased scar mobility  Visit Diagnosis: Muscle weakness (generalized)  Lack of coordination     Problem List Patient Active Problem List   Diagnosis Date Noted   C. difficile diarrhea 05/25/2021   Postpartum endometritis 04/23/2021   Postpartum hemorrhage 04/06/2021   Elevated LFTs 11/14/2020   Obsessive-compulsive disorder 10/15/2020    Stacy Gardner, PT, DPT 12/13/222:48 PM   [facility] @ [address] [place], [place], [address] Phone: [phone]   Fax:  [phone]  Name: Sarah Estrada MRN: [id number] Date of Birth: [birth year]

## 2021-08-16 ENCOUNTER — Other Ambulatory Visit: Payer: Self-pay

## 2021-08-16 ENCOUNTER — Ambulatory Visit: Payer: 59 | Admitting: Physical Therapy

## 2021-08-16 DIAGNOSIS — M6281 Muscle weakness (generalized): Secondary | ICD-10-CM | POA: Diagnosis not present

## 2021-08-16 DIAGNOSIS — R279 Unspecified lack of coordination: Secondary | ICD-10-CM

## 2021-08-16 NOTE — Therapy (Signed)
[facility] @ [address] [place], [place], [address] Phone: [phone]   Fax:  [phone]  Physical Therapy Treatment  Patient Details  Name: Sarah Estrada MRN: [id number] Date of Birth: 1980/10/31 Referring Provider (PT): Gabriel Carina, [place]   Encounter Date: 08/16/2021   PT End of Session - 08/16/21 1304     Visit Number 3    Number of Visits 25    Date for PT Re-Evaluation 10/15/21    Authorization Type [employer] choice    PT Start Time 1229    PT Stop Time 1305    PT Time Calculation (min) 36 min    Activity Tolerance Patient tolerated treatment well    Behavior During Therapy [facility] for tasks assessed/performed             Past Medical History:  Diagnosis Date   Hypothyroidism    OCD (obsessive compulsive disorder)    Supervision of high risk primigravida of advanced maternal age, antepartum 10/15/2020    Nursing Staff Provider Office Location [facility] Dating  LMP Language  English Anatomy US  Normal Flu Vaccine  04/2020 Genetic Screen  NIPS: Low risk   AFP: Neg    Horizon 4: Neg TDaP Vaccine  01/26/21 Hgb A1C or  GTT Early Hg A1C 5.3 Third trimester WNL  COVID Vaccine Pfizer-All 3   LAB RESULTS  Rhogam  NA Blood Type AB/Positive/-- (02/18 1117)  Feeding Plan Breast Antibody Negative (02/18 1117) Con    Past Surgical History:  Procedure Laterality Date   WISDOM TOOTH EXTRACTION      There were no vitals filed for this visit.   Subjective Assessment - 08/16/21 1231     Subjective Pt reports she has had no pain but has a little bit of "something is there" all the time between legs. Pt reports intermittent strong urges to urinate but this is not as often as it was,no leakage.    Pertinent History vaginal birth 14 weeks ago, had succal tear and right episiotomy    How long can you sit comfortably? no limit    How long can you stand comfortably? no limit    How long can you walk comfortably? no limit     Patient Stated Goals to return to running and have improved pelvic stability without discomfort.    Currently in Pain? No/denies                            Pelvic Floor Special Questions - 08/16/21 0001     Prolapse Anterior Wall   grade 1   Pelvic Floor Internal Exam patient identified and patient confirms consent for PT to perform internal soft tissue work and muscle strength and integrity assessment    Exam Type Vaginal    Sensation WFL    Palpation no pain but tightness felt at tear site    Strength fair squeeze, definite lift    Strength # of reps 7    Strength # of seconds 10    Tone improving               [facility] Adult PT Treatment/Exercise - 08/16/21 0001       Self-Care   Self-Care Other Self-Care Comments    Other Self-Care Comments  pt educated on vaginal weights for use at home and how to progress for improved feedback internally at home as pt reported she felt she couldn't tell  sometimes if she was doing the contraction well.      Neuro Re-ed    Neuro Re-ed Details  pelvic floor strengthening exercises with internal feedback from PT for 2x10 pelvic contractions, 6Y69 quick flicks, and S85 [id number] holds improving to 3/5 for majority of contractions with minimal cues.      Exercises   Exercises Lumbar;Knee/Hip      Lumbar Exercises: Standing   Other Standing Lumbar Exercises sit<>stand with pelvic floor contractions 2x10      Lumbar Exercises: Supine   Other Supine Lumbar Exercises --                     PT Education - 08/16/21 1302     Education Details Pt educated on pelvic floor strengthening, vaginal weights, and functional strengthening with pelvic floor with sit<>stands    Person(s) Educated Patient    Methods Explanation;Demonstration;Tactile cues;Verbal cues;Handout    Comprehension Returned demonstration;Verbalized understanding              PT Short Term Goals - 07/15/21 1047       PT SHORT TERM GOAL #1   Title  pt to be I HEP    Time 6    Period Weeks    Status New    Target Date 08/26/21      PT SHORT TERM GOAL #2   Title pt to demonstrate at least 3/5 pelvic floor strength to improve pelvic stability for return to running and decrease leakage symptoms.    Time 6    Period Weeks    Status New    Target Date 08/26/21      PT SHORT TERM GOAL #3   Title pt to demonstrate ability to lift 15# with good mechanics from ground level with improved breathing mechanics to decrease strain at pelvic floor    Time 6    Period Weeks    Status New    Target Date 08/26/21               PT Long Term Goals - 07/15/21 1100       PT LONG TERM GOAL #1   Title pt to be I advanced HEP    Time 3    Period Months    Status New    Target Date 10/15/21      PT LONG TERM GOAL #2   Title pt to demonstrate at least 4/5 pelvic floor strength to improve pelvic stability for return to running and decrease leakage symptoms.    Time 3    Period Months    Status New    Target Date 10/15/21      PT LONG TERM GOAL #3   Title pt to demonstrate ability to lift 25# with good mechanics from ground level with improved breathing mechanics to decrease strain at pelvic floor    Time 3    Period Months    Status New    Target Date 10/15/21      PT LONG TERM GOAL #4   Title pt demonstrate 5/5 bil hip strength for improved pelvic stability for return to running    Time 3    Period Months    Status New    Target Date 10/15/21                   Plan - 08/16/21 1304     Clinical Impression Statement Pt presents to clinic reporting improvement in leakage symptoms, no pelvic pain,  but does have some feeling "that something is just there between my legs". Pt reports she has been doing some of the HEP but doesn't know all the time if she is doing the pelvic contractionscorrectly. Pt consented to internal treatment this date, continues to demonstrate at least grade 1 anterior wall prolapse in supine, and had  tightness at scar site at posterior aspect of vaginal opening. Pt denied pain throughout and demonstrate 3/5 strength with improvement in endurance and coordination though deficits still remain. Pt educated on this and vaginal weights with handout given to potentially improve understanding with internal feedback at home to improve pelvic floor strengthening. pt also educated on functional coordination and activation of pelvic floor with transfers to/from sitting.  Pt denied additional questions and reported improved understand at end of session. Pt tolerated well and continues to demonstrate need of PT to address urinary urgency, urinary incontinence, global strengthening postpartum, and return to running per pt goals    Personal Factors and Comorbidities Comorbidity 1    Comorbidities postpartum    Examination-Activity Limitations Continence;Hygiene/Grooming;Locomotion Level    Examination-Participation Restrictions Community Activity;Interpersonal Relationship    Stability/Clinical Decision Making Stable/Uncomplicated    Rehab Potential Good    PT Frequency 1x / week    PT Duration 12 weeks    PT Treatment/Interventions ADLs/Self Care Home Management;Aquatic Therapy;Functional mobility training;Therapeutic activities;Therapeutic exercise;Neuromuscular re-education;Manual techniques;Patient/family education;Scar mobilization;Passive range of motion;Energy conservation    PT Next Visit Plan strengthening, stretching thoracic spine, and pelvic floor coordination    PT Home Exercise Plan [id number]    Consulted and Agree with Plan of Care Patient             Patient will benefit from skilled therapeutic intervention in order to improve the following deficits and impairments:  Decreased coordination, Decreased endurance, Pain, Improper body mechanics, Impaired flexibility, Decreased mobility, Decreased strength, Postural dysfunction, Decreased scar mobility  Visit Diagnosis: Muscle weakness  (generalized)  Lack of coordination     Problem List Patient Active Problem List   Diagnosis Date Noted   C. difficile diarrhea 05/25/2021   Postpartum endometritis 04/23/2021   Postpartum hemorrhage 04/06/2021   Elevated LFTs 11/14/2020   Obsessive-compulsive disorder 10/15/2020    No emotional/communication barriers or cognitive limitation. Patient is motivated to learn. Patient understands and agrees with treatment goals and plan. PT explains patient will be examined in standing, sitting, and lying down to see how their muscles and joints work. When they are ready, they will be asked to remove their underwear so PT can examine their perineum. The patient is also given the option of providing their own chaperone as one is not provided in our facility. The patient also has the right and is explained the right to defer or refuse any part of the evaluation or treatment including the internal exam. With the patient's consent, PT will use one gloved finger to gently assess the muscles of the pelvic floor, seeing how well it contracts and relaxes and if there is muscle symmetry. After, the patient will get dressed and PT and patient will discuss exam findings and plan of care. PT and patient discuss plan of care, schedule, attendance policy and HEP activities.   Stacy Gardner, PT, DPT 12/20/221:10 PM   [facility] @ [place], [place], [address] Phone: [phone]   Fax:  [phone]  Name: Sarah Estrada MRN: [id number] Date of Birth: 14-Dec-1980

## 2021-08-17 ENCOUNTER — Encounter: Payer: Self-pay | Admitting: Physical Therapy

## 2021-08-30 DIAGNOSIS — H5213 Myopia, bilateral: Secondary | ICD-10-CM | POA: Diagnosis not present

## 2021-08-30 DIAGNOSIS — H52223 Regular astigmatism, bilateral: Secondary | ICD-10-CM | POA: Diagnosis not present

## 2021-09-02 ENCOUNTER — Encounter: Payer: 59 | Admitting: Physical Therapy

## 2021-09-06 ENCOUNTER — Ambulatory Visit: Payer: 59 | Attending: Certified Nurse Midwife | Admitting: Physical Therapy

## 2021-09-06 ENCOUNTER — Other Ambulatory Visit: Payer: Self-pay

## 2021-09-06 DIAGNOSIS — R279 Unspecified lack of coordination: Secondary | ICD-10-CM | POA: Diagnosis not present

## 2021-09-06 DIAGNOSIS — M6281 Muscle weakness (generalized): Secondary | ICD-10-CM | POA: Diagnosis not present

## 2021-09-06 NOTE — Therapy (Signed)
[facility] @ [place], [place], [address] Phone: [phone]   Fax:  [phone]  Physical Therapy Treatment  Patient Details  Name: Sarah Estrada MRN: [id number] Date of Birth: [birth year] Referring Provider (PT): Gabriel Carina, [place]   Encounter Date: 09/06/2021   PT End of Session - 09/06/21 0955     Visit Number 4    Number of Visits 25    Date for PT Re-Evaluation 10/15/21    Authorization Type [employer] choice    PT Start Time [phone]   pt arrival time   PT Stop Time 1012    PT Time Calculation (min) 34 min    Activity Tolerance Patient tolerated treatment well    Behavior During Therapy [facility] for tasks assessed/performed             Past Medical History:  Diagnosis Date   Hypothyroidism    OCD (obsessive compulsive disorder)    Supervision of high risk primigravida of advanced maternal age, antepartum 10/15/2020    Nursing Staff Provider Office Location [facility] Dating  LMP Language  English Anatomy US  Normal Flu Vaccine  04/2020 Genetic Screen  NIPS: Low risk   AFP: Neg    Horizon 4: Neg TDaP Vaccine  01/26/21 Hgb A1C or  GTT Early Hg A1C 5.3 Third trimester WNL  COVID Vaccine Pfizer-All 3   LAB RESULTS  Rhogam  NA Blood Type AB/Positive/-- (02/18 1117)  Feeding Plan Breast Antibody Negative (02/18 1117) Con    Past Surgical History:  Procedure Laterality Date   WISDOM TOOTH EXTRACTION      There were no vitals filed for this visit.   Subjective Assessment - 09/06/21 0941     Subjective Pt reports she has had no pain, hasn't noticed the bulge feeling as much and is now increasing working out, feels like she having leakage about the same frequency but only with strong urge and get to bathroom and then leaks.    Pertinent History vaginal birth 14 weeks ago, had succal tear and right episiotomy    How long can you sit comfortably? no limit    How long can you stand comfortably? no limit    How long  can you walk comfortably? no limit    Currently in Pain? No/denies                               [facility] Adult PT Treatment/Exercise - 09/06/21 0001       Lumbar Exercises: Standing   Functional Squats 20 reps    Functional Squats Limitations 10# kettle bell with exhale and PF contraction    Forward Lunge 10 reps    Forward Lunge Limitations with exhale and PF contraction    Other Standing Lumbar Exercises palloffs green band x10 each    Other Standing Lumbar Exercises mario punches x10 5# DB      Lumbar Exercises: Seated   Other Seated Lumbar Exercises seated opp arm/leg press 5s hold with breathing and core      Lumbar Exercises: Supine   Clam 20 reps;Limitations    Clam Limitations black loop with coordination of breahing and pelvic floor and core activation    Bridge 10 reps    Bridge Limitations 5s at top    Other Supine Lumbar Exercises hip flexion black loop x10 black loop with coordination of breahing and pelvic floor and core activation  Lumbar Exercises: Quadruped   Opposite Arm/Leg Raise Right arm/Left leg;Left arm/Right leg;10 reps    Other Quadruped Lumbar Exercises TA activations x10                     PT Education - 09/06/21 0954     Education Details Pt educated to continue HEP and breathing mechanics with all exericsing. Urge drill educated on    Person(s) Educated Patient    Methods Explanation;Demonstration;Tactile cues;Verbal cues;Handout    Comprehension Returned demonstration;Verbalized understanding              PT Short Term Goals - 07/15/21 1047       PT SHORT TERM GOAL #1   Title pt to be I HEP    Time 6    Period Weeks    Status New    Target Date 08/26/21      PT SHORT TERM GOAL #2   Title pt to demonstrate at least 3/5 pelvic floor strength to improve pelvic stability for return to running and decrease leakage symptoms.    Time 6    Period Weeks    Status New    Target Date 08/26/21      PT  SHORT TERM GOAL #3   Title pt to demonstrate ability to lift 15# with good mechanics from ground level with improved breathing mechanics to decrease strain at pelvic floor    Time 6    Period Weeks    Status New    Target Date 08/26/21               PT Long Term Goals - 07/15/21 1100       PT LONG TERM GOAL #1   Title pt to be I advanced HEP    Time 3    Period Months    Status New    Target Date 10/15/21      PT LONG TERM GOAL #2   Title pt to demonstrate at least 4/5 pelvic floor strength to improve pelvic stability for return to running and decrease leakage symptoms.    Time 3    Period Months    Status New    Target Date 10/15/21      PT LONG TERM GOAL #3   Title pt to demonstrate ability to lift 25# with good mechanics from ground level with improved breathing mechanics to decrease strain at pelvic floor    Time 3    Period Months    Status New    Target Date 10/15/21      PT LONG TERM GOAL #4   Title pt demonstrate 5/5 bil hip strength for improved pelvic stability for return to running    Time 3    Period Months    Status New    Target Date 10/15/21                   Plan - 09/06/21 1046     Clinical Impression Statement Pt presents reporting continued no pain, less often felt bulge and has increased working out levels, able to coordinate breathing and core activations more often functionally. Pt session focused on hip and core strengthening with coordination of breathing and pelvic floor with core activation for decreased symptoms of leakage, strain at pelvic floor and improve strengthening to return to running. Pt denied additional questions and reported improved understand at end of session. Pt tolerated well and continues to demonstrate need of PT to address urinary urgency,  urinary incontinence, global strengthening postpartum, and return to running per pt goals    Personal Factors and Comorbidities Comorbidity 1    Comorbidities postpartum     Examination-Activity Limitations Continence;Hygiene/Grooming;Locomotion Level    Examination-Participation Restrictions Community Activity;Interpersonal Relationship    Stability/Clinical Decision Making Stable/Uncomplicated    Rehab Potential Good    PT Frequency 1x / week    PT Duration 12 weeks    PT Treatment/Interventions ADLs/Self Care Home Management;Aquatic Therapy;Functional mobility training;Therapeutic activities;Therapeutic exercise;Neuromuscular re-education;Manual techniques;Patient/family education;Scar mobilization;Passive range of motion;Energy conservation    PT Next Visit Plan strengthening, stretching thoracic spine, and pelvic floor coordination possibly internal    PT Home Exercise Plan [id number]    Consulted and Agree with Plan of Care Patient             Patient will benefit from skilled therapeutic intervention in order to improve the following deficits and impairments:  Decreased coordination, Decreased endurance, Pain, Improper body mechanics, Impaired flexibility, Decreased mobility, Decreased strength, Postural dysfunction, Decreased scar mobility  Visit Diagnosis: Muscle weakness (generalized)  Lack of coordination     Problem List Patient Active Problem List   Diagnosis Date Noted   C. difficile diarrhea 05/25/2021   Postpartum endometritis 04/23/2021   Postpartum hemorrhage 04/06/2021   Elevated LFTs 11/14/2020   Obsessive-compulsive disorder 10/15/2020   Stacy Gardner, PT, DPT 09/06/2308:49 AM   [facility] @ [place] [address] [place], [place], [address] Phone: [phone]   Fax:  [phone]  Name: Sarah Estrada MRN: [id number] Date of Birth: 1980-12-23

## 2021-09-06 NOTE — Patient Instructions (Signed)

## 2021-09-09 ENCOUNTER — Other Ambulatory Visit (HOSPITAL_COMMUNITY): Payer: Self-pay

## 2021-09-09 DIAGNOSIS — R300 Dysuria: Secondary | ICD-10-CM | POA: Diagnosis not present

## 2021-09-23 ENCOUNTER — Encounter: Payer: 59 | Admitting: Physical Therapy

## 2021-09-27 ENCOUNTER — Encounter: Payer: 59 | Admitting: Physical Therapy

## 2021-10-04 ENCOUNTER — Other Ambulatory Visit: Payer: Self-pay

## 2021-10-04 ENCOUNTER — Ambulatory Visit: Payer: 59 | Attending: Certified Nurse Midwife | Admitting: Physical Therapy

## 2021-10-04 DIAGNOSIS — R293 Abnormal posture: Secondary | ICD-10-CM | POA: Insufficient documentation

## 2021-10-04 DIAGNOSIS — M6281 Muscle weakness (generalized): Secondary | ICD-10-CM | POA: Diagnosis not present

## 2021-10-04 DIAGNOSIS — R279 Unspecified lack of coordination: Secondary | ICD-10-CM | POA: Diagnosis not present

## 2021-10-04 DIAGNOSIS — R278 Other lack of coordination: Secondary | ICD-10-CM | POA: Insufficient documentation

## 2021-10-04 NOTE — Therapy (Signed)
[facility] @ [address] [place], [place], [address] Phone: [phone]   Fax:  [phone]  Physical Therapy Treatment  Patient Details  Name: Sarah Estrada MRN: [id number] Date of Birth: [birth year] Referring Provider (PT): Gabriel Carina, [place]   Encounter Date: 10/04/2021   PT End of Session - 10/04/21 0940     Visit Number 5    Number of Visits 25    Date for PT Re-Evaluation 10/15/21    Authorization Type [employer] choice    PT Start Time [phone]   pt arrival time   PT Stop Time 1014    PT Time Calculation (min) 35 min    Activity Tolerance Patient tolerated treatment well    Behavior During Therapy [facility] for tasks assessed/performed             Past Medical History:  Diagnosis Date   Hypothyroidism    OCD (obsessive compulsive disorder)    Supervision of high risk primigravida of advanced maternal age, antepartum 10/15/2020    Nursing Staff Provider Office Location [facility] Dating  LMP Language  English Anatomy US  Normal Flu Vaccine  04/2020 Genetic Screen  NIPS: Low risk   AFP: Neg    Horizon 4: Neg TDaP Vaccine  01/26/21 Hgb A1C or  GTT Early Hg A1C 5.3 Third trimester WNL  COVID Vaccine Pfizer-All 3   LAB RESULTS  Rhogam  NA Blood Type AB/Positive/-- (02/18 1117)  Feeding Plan Breast Antibody Negative (02/18 1117) Con    Past Surgical History:  Procedure Laterality Date   WISDOM TOOTH EXTRACTION      There were no vitals filed for this visit.   Subjective Assessment - 10/04/21 0940     Subjective Pt reports she felt like she was getting a UTI but tested (-) and did have some leakage with this but this has resolved on its own, had back pain after lifting daughter from the floor but this has also gotten better. Pt reports she feels like her symptoms overall have gotten better since starting PT, and she doesn't feel a bulge nearly as often now and when she does there is no pattern per pt. No longer having pain with  intercourse.    Pertinent History vaginal birth 14 weeks ago, had succal tear and right episiotomy    How long can you sit comfortably? no limit    How long can you stand comfortably? no limit    Patient Stated Goals to return to running and have improved pelvic stability without discomfort.    Currently in Pain? No/denies                            Pelvic Floor Special Questions - 10/04/21 0001     Prolapse Anterior Wall   grade 1 and no worsening with cough or any reps with pelvic contractions or relaxation   Pelvic Floor Internal Exam patient identified and patient confirms consent for PT to perform internal soft tissue work and muscle strength and integrity assessment    Exam Type Vaginal    Sensation WFL    Palpation no TTP    Strength strong squeeze, against strong resistance    Strength # of reps 9    Strength # of seconds 8    Tone WFL               [facility] Adult PT Treatment/Exercise - 10/04/21 0001  Self-Care   Self-Care Other Self-Care Comments    Other Self-Care Comments  Pt educated on return to running HEP and this was went over at end of session. Pt also educated on self assessment while workingout at home to limit/prevent abdominal coning with activity.      Manual Therapy   Manual Therapy Internal Pelvic Floor    Internal Pelvic Floor Pt directed in 2x10 pelvic floor contraction, 1O10 quick flicks, [id number] isometric holds (one third rep pt demonstrated decreased strength on 8s to 4/5 strength)                     PT Education - 10/04/21 1017     Education Details Pt educated on returning to running progressions and core activiations    Person(s) Educated Patient    Methods Explanation;Demonstration;Tactile cues;Verbal cues    Comprehension Verbalized understanding;Returned demonstration              PT Short Term Goals - 07/15/21 1047       PT SHORT TERM GOAL #1   Title pt to be I HEP    Time 6    Period Weeks     Status New    Target Date 08/26/21      PT SHORT TERM GOAL #2   Title pt to demonstrate at least 3/5 pelvic floor strength to improve pelvic stability for return to running and decrease leakage symptoms.    Time 6    Period Weeks    Status New    Target Date 08/26/21      PT SHORT TERM GOAL #3   Title pt to demonstrate ability to lift 15# with good mechanics from ground level with improved breathing mechanics to decrease strain at pelvic floor    Time 6    Period Weeks    Status New    Target Date 08/26/21               PT Long Term Goals - 07/15/21 1100       PT LONG TERM GOAL #1   Title pt to be I advanced HEP    Time 3    Period Months    Status New    Target Date 10/15/21      PT LONG TERM GOAL #2   Title pt to demonstrate at least 4/5 pelvic floor strength to improve pelvic stability for return to running and decrease leakage symptoms.    Time 3    Period Months    Status New    Target Date 10/15/21      PT LONG TERM GOAL #3   Title pt to demonstrate ability to lift 25# with good mechanics from ground level with improved breathing mechanics to decrease strain at pelvic floor    Time 3    Period Months    Status New    Target Date 10/15/21      PT LONG TERM GOAL #4   Title pt demonstrate 5/5 bil hip strength for improved pelvic stability for return to running    Time 3    Period Months    Status New    Target Date 10/15/21                   Plan - 10/04/21 1018     Clinical Impression Statement Pt presents to clinic reporting improvement with leakage overall with only a few instances of leakage since last session, no pain at all, and  feels she has better understanding of how to contract/relax pelvic floor with exercise and activate core when needed. Pt session focused in internal pelvic floor strengthening with improvement noted in strength, endurance and coordination, no worsening of prolapse in hooklying assessed. Pt also educated on  progression with return to running. Pt tolerated well and all questions answered at end of session. Pt  continues to demonstrate need of PT to address urinary urgency, urinary incontinence, global strengthening postpartum, and return to running per pt goals    Personal Factors and Comorbidities Comorbidity 1    Comorbidities postpartum    Examination-Activity Limitations Continence;Hygiene/Grooming;Locomotion Level    Examination-Participation Restrictions Community Activity;Interpersonal Relationship    Stability/Clinical Decision Making Stable/Uncomplicated    Rehab Potential Good    PT Frequency 1x / week    PT Duration 12 weeks    PT Treatment/Interventions ADLs/Self Care Home Management;Aquatic Therapy;Functional mobility training;Therapeutic activities;Therapeutic exercise;Neuromuscular re-education;Manual techniques;Patient/family education;Scar mobilization;Passive range of motion;Energy conservation    PT Next Visit Plan strengthening, stretching thoracic spine, and pelvic floor coordination    PT Home Exercise Plan [id number]    Consulted and Agree with Plan of Care Patient             Patient will benefit from skilled therapeutic intervention in order to improve the following deficits and impairments:  Decreased coordination, Decreased endurance, Pain, Improper body mechanics, Impaired flexibility, Decreased mobility, Decreased strength, Postural dysfunction, Decreased scar mobility  Visit Diagnosis: Muscle weakness (generalized)  Other lack of coordination     Problem List Patient Active Problem List   Diagnosis Date Noted   C. difficile diarrhea 05/25/2021   Postpartum endometritis 04/23/2021   Postpartum hemorrhage 04/06/2021   Elevated LFTs 11/14/2020   Obsessive-compulsive disorder 10/15/2020  No emotional/communication barriers or cognitive limitation. Patient is motivated to learn. Patient understands and agrees with treatment goals and plan. PT explains patient  will be examined in standing, sitting, and lying down to see how their muscles and joints work. When they are ready, they will be asked to remove their underwear so PT can examine their perineum. The patient is also given the option of providing their own chaperone as one is not provided in our facility. The patient also has the right and is explained the right to defer or refuse any part of the evaluation or treatment including the internal exam. With the patient's consent, PT will use one gloved finger to gently assess the muscles of the pelvic floor, seeing how well it contracts and relaxes and if there is muscle symmetry. After, the patient will get dressed and PT and patient will discuss exam findings and plan of care. PT and patient discuss plan of care, schedule, attendance policy and HEP activities.   Stacy Gardner, PT, DPT 10/04/2308:20 AM   [facility] @ [place] [place], [place], [address] Phone: [phone]   Fax:  [phone]  Name: NATALEY BAHRI MRN: [id number] Date of Birth: 11/05/1980

## 2021-10-04 NOTE — Patient Instructions (Signed)
Return to Running Criteria (from [place], 2019)  Ability to perform 20-30 repetitions of each of the following without fatigue: Single calf raise (can start with fingertips on wall as needed for support) Single leg bridge Single leg sit to stand Sidelying hip abduction (can start with fingertips on wall as needed for support)   FOR INDIVIDUALS WITH PELVIC FLOOR SYMPTOMS: Ability to perform without leaking or prolapse symptoms: Walk 30 minutes Single leg balance 30 seconds Single leg squat 10 repetitions (2-3 sets) Jog in place 1 minute Forward bounding 10 repetitions Single leg hop 10 repetitions Single leg runners 10 repetitions

## 2021-10-11 ENCOUNTER — Ambulatory Visit: Payer: 59 | Admitting: Physical Therapy

## 2021-10-11 ENCOUNTER — Other Ambulatory Visit: Payer: Self-pay

## 2021-10-11 DIAGNOSIS — R278 Other lack of coordination: Secondary | ICD-10-CM

## 2021-10-11 DIAGNOSIS — M6281 Muscle weakness (generalized): Secondary | ICD-10-CM | POA: Diagnosis not present

## 2021-10-11 DIAGNOSIS — R293 Abnormal posture: Secondary | ICD-10-CM | POA: Diagnosis not present

## 2021-10-11 DIAGNOSIS — R279 Unspecified lack of coordination: Secondary | ICD-10-CM | POA: Diagnosis not present

## 2021-10-11 NOTE — Therapy (Signed)
[facility] @ [address] [place], [place], [address] Phone: [phone]   Fax:  [phone]  Physical Therapy Treatment  Patient Details  Name: Sarah Estrada MRN: [id number] Date of Birth: 01/07/1981 Referring Provider (PT): Gabriel Carina, [place]   Encounter Date: 10/11/2021   PT End of Session - 10/11/21 1101     Visit Number 6    Number of Visits 25    Date for PT Re-Evaluation 10/15/21    Authorization Type [employer] choice    PT Start Time 0932    PT Stop Time 1015    PT Time Calculation (min) 43 min    Activity Tolerance Patient tolerated treatment well    Behavior During Therapy [facility] for tasks assessed/performed             Past Medical History:  Diagnosis Date   Hypothyroidism    OCD (obsessive compulsive disorder)    Supervision of high risk primigravida of advanced maternal age, antepartum 10/15/2020    Nursing Staff Provider Office Location [facility] Dating  LMP Language  English Anatomy US  Normal Flu Vaccine  04/2020 Genetic Screen  NIPS: Low risk   AFP: Neg    Horizon 4: Neg TDaP Vaccine  01/26/21 Hgb A1C or  GTT Early Hg A1C 5.3 Third trimester WNL  COVID Vaccine Pfizer-All 3   LAB RESULTS  Rhogam  NA Blood Type AB/Positive/-- (02/18 1117)  Feeding Plan Breast Antibody Negative (02/18 1117) Con    Past Surgical History:  Procedure Laterality Date   WISDOM TOOTH EXTRACTION      There were no vitals filed for this visit.   Subjective Assessment - 10/11/21 0937     Subjective Pt reports she had one small smount of leakage with a sneeze this week; pt hasn't felt bulge as much this week. Pt has attempted return to running exercises and attempted running once last week with mild feeling of bulge with running and tried 1 min running and a few walking quickly with higher incline did not feel it.    Currently in Pain? No/denies                               [facility] Adult PT  Treatment/Exercise - 10/11/21 0001       Exercises   Exercises Knee/Hip;Lumbar      Lumbar Exercises: Stretches   Other Lumbar Stretch Exercise thoacic extension at form rolling supine 2x45s and with foam roller horizonal 2x45s      Lumbar Exercises: Standing   Functional Squats 20 reps    Functional Squats Limitations 15# kettle bell    Side Lunge 15 reps    Side Lunge Limitations each side 15# kettle bell    Other Standing Lumbar Exercises farmers carry 300' with 15# and 20# kettlebell cues for midline stance and no sway; standing marching with 15# adn 20# kettle bells in hands cues for posture, midline and core activation 2x10    Other Standing Lumbar Exercises trampoline small bounding with feet shoulders width apart x20, staggered x20; and lateral shifting x20      Lumbar Exercises: Supine   Bridge 20 reps    Bridge Limitations black loop for hip abduction and kegel at top      Lumbar Exercises: Quadruped   Madcat/Old Horse 10 reps    Madcat/Old Horse Limitations with isolated movements at head/thoracic/lumbar    Other Quadruped Lumbar  Exercises knee hovers with unilateral ball 2x10                     PT Education - 10/11/21 1102     Education Details pt educated on core activation and maintaining pelvic floor activiation with activity    Person(s) Educated Patient    Methods Explanation;Demonstration;Tactile cues;Verbal cues    Comprehension Returned demonstration;Verbalized understanding              PT Short Term Goals - 07/15/21 1047       PT SHORT TERM GOAL #1   Title pt to be I HEP    Time 6    Period Weeks    Status New    Target Date 08/26/21      PT SHORT TERM GOAL #2   Title pt to demonstrate at least 3/5 pelvic floor strength to improve pelvic stability for return to running and decrease leakage symptoms.    Time 6    Period Weeks    Status New    Target Date 08/26/21      PT SHORT TERM GOAL #3   Title pt to demonstrate ability to  lift 15# with good mechanics from ground level with improved breathing mechanics to decrease strain at pelvic floor    Time 6    Period Weeks    Status New    Target Date 08/26/21               PT Long Term Goals - 07/15/21 1100       PT LONG TERM GOAL #1   Title pt to be I advanced HEP    Time 3    Period Months    Status New    Target Date 10/15/21      PT LONG TERM GOAL #2   Title pt to demonstrate at least 4/5 pelvic floor strength to improve pelvic stability for return to running and decrease leakage symptoms.    Time 3    Period Months    Status New    Target Date 10/15/21      PT LONG TERM GOAL #3   Title pt to demonstrate ability to lift 25# with good mechanics from ground level with improved breathing mechanics to decrease strain at pelvic floor    Time 3    Period Months    Status New    Target Date 10/15/21      PT LONG TERM GOAL #4   Title pt demonstrate 5/5 bil hip strength for improved pelvic stability for return to running    Time 3    Period Months    Status New    Target Date 10/15/21                   Plan - 10/11/21 1102     Clinical Impression Statement pt presents to clinic reporting she has seen improvement overall with only one small leak last week and bulge is less bothersome. Pt session focused on strengthening with improved core activations and coordination of breathing and pelvic floor for improved support and decreased strain at pelvic floor. Pt tolerated well, increased exercise challenge with increased weights, trampoline included and more standing core exercises. Pt tolerated well. Pt continues to demonstrate need of PT to address urinary urgency, urinary incontinence, global strengthening postpartum, and return to running per pt goals    Personal Factors and Comorbidities Comorbidity 1    Comorbidities postpartum  Examination-Activity Limitations Continence;Hygiene/Grooming;Locomotion Level    Examination-Participation  Restrictions Community Activity;Interpersonal Relationship    Stability/Clinical Decision Making Stable/Uncomplicated    Rehab Potential Good    PT Frequency 1x / week    PT Duration 12 weeks    PT Treatment/Interventions ADLs/Self Care Home Management;Aquatic Therapy;Functional mobility training;Therapeutic activities;Therapeutic exercise;Neuromuscular re-education;Manual techniques;Patient/family education;Scar mobilization;Passive range of motion;Energy conservation    PT Next Visit Plan strengthening, stretching thoracic spine, and pelvic floor coordination    PT Home Exercise Plan [id number]    [id number] and Agree with Plan of Care Patient             Patient will benefit from skilled therapeutic intervention in order to improve the following deficits and impairments:  Decreased coordination, Decreased endurance, Pain, Improper body mechanics, Impaired flexibility, Decreased mobility, Decreased strength, Postural dysfunction, Decreased scar mobility  Visit Diagnosis: Muscle weakness (generalized)  Other lack of coordination     Problem List Patient Active Problem List   Diagnosis Date Noted   C. difficile diarrhea 05/25/2021   Postpartum endometritis 04/23/2021   Postpartum hemorrhage 04/06/2021   Elevated LFTs 11/14/2020   Obsessive-compulsive disorder 10/15/2020   Stacy Gardner, PT, DPT 10/11/2309:04 AM   [facility] @ [address] [place] [place], [place], [address] Phone: [phone]   Fax:  [phone]  Name: Sarah Estrada MRN: [id number] Date of Birth: [birth year]

## 2021-10-18 ENCOUNTER — Encounter: Payer: 59 | Admitting: Physical Therapy

## 2021-10-24 ENCOUNTER — Encounter: Payer: Self-pay | Admitting: Physical Therapy

## 2021-10-25 ENCOUNTER — Other Ambulatory Visit: Payer: Self-pay

## 2021-10-25 ENCOUNTER — Ambulatory Visit: Payer: 59 | Admitting: Physical Therapy

## 2021-10-25 ENCOUNTER — Encounter: Payer: Self-pay | Admitting: Physical Therapy

## 2021-10-25 DIAGNOSIS — R279 Unspecified lack of coordination: Secondary | ICD-10-CM | POA: Diagnosis not present

## 2021-10-25 DIAGNOSIS — R278 Other lack of coordination: Secondary | ICD-10-CM | POA: Diagnosis not present

## 2021-10-25 DIAGNOSIS — R293 Abnormal posture: Secondary | ICD-10-CM | POA: Diagnosis not present

## 2021-10-25 DIAGNOSIS — M6281 Muscle weakness (generalized): Secondary | ICD-10-CM | POA: Diagnosis not present

## 2021-10-25 NOTE — Therapy (Signed)
[facility] @ [address] [place], [place], [address] Phone: [phone]   Fax:  [phone]  Physical Therapy Treatment  Patient Details  Name: Sarah Estrada MRN: [id number] Date of Birth: 1981-04-21 Referring Provider (PT): Gabriel Carina, [place]   Encounter Date: 10/25/2021   PT End of Session - 10/25/21 0935     Visit Number 7    Number of Visits 25    Date for PT Re-Evaluation 01/22/22    Authorization Type [employer] choice    PT Start Time 0933    PT Stop Time 1013    PT Time Calculation (min) 40 min    Activity Tolerance Patient tolerated treatment well    Behavior During Therapy [facility] for tasks assessed/performed             Past Medical History:  Diagnosis Date   Hypothyroidism    OCD (obsessive compulsive disorder)    Supervision of high risk primigravida of advanced maternal age, antepartum 10/15/2020    Nursing Staff Provider Office Location [facility] Dating  LMP Language  English Anatomy US  Normal Flu Vaccine  04/2020 Genetic Screen  NIPS: Low risk   AFP: Neg    Horizon 4: Neg TDaP Vaccine  01/26/21 Hgb A1C or  GTT Early Hg A1C 5.3 Third trimester WNL  COVID Vaccine Pfizer-All 3   LAB RESULTS  Rhogam  NA Blood Type AB/Positive/-- (02/18 1117)  Feeding Plan Breast Antibody Negative (02/18 1117) Con    Past Surgical History:  Procedure Laterality Date   WISDOM TOOTH EXTRACTION      There were no vitals filed for this visit.       [facility] PT Assessment - 10/25/21 0001       Assessment   Medical Diagnosis N81.89 (ICD-10-CM) - Pelvic floor weakness in female    Referring Provider (PT) Gabriel Carina, CNM    Onset Date/Surgical Date --   14 weeks ago   Prior Therapy currently treated with PFPT                           [facility] Adult PT Treatment/Exercise - 10/25/21 0001       Exercises   Exercises Knee/Hip;Lumbar      Lumbar Exercises: Aerobic   Elliptical 6 min resistance 3 and  incline 5 for improved breathing mechanics and pelvic floor activiation in increased speed activity      Lumbar Exercises: Standing   Functional Squats 20 reps    Functional Squats Limitations 20# kettle bell    Other Standing Lumbar Exercises farmers carry 500' with 15# and 20# kettlebell cues for midline stance and no sway; standing marching with black loop on feet2x10, single leg sit<>stand from lowest setting on mat table 2x10; 2x10 black loop hip extension in quad                     PT Education - 10/25/21 1013     Education Details Pt educated on continued HEP, core activation and breathing mechanics also vaginal moisturizer handout given and went over as pt reported she sometimes has some dryness and very mild irritation at vulva    Person(s) Educated Patient    Methods Explanation;Demonstration;Tactile cues;Handout    Comprehension Returned demonstration;Verbalized understanding              PT Short Term Goals - 10/25/21 1021       PT SHORT  TERM GOAL #1   Title pt to be I HEP    Time 6    Period Weeks    Status Achieved    Target Date 08/26/21      PT SHORT TERM GOAL #2   Title pt to demonstrate at least 3/5 pelvic floor strength to improve pelvic stability for return to running and decrease leakage symptoms.    Time 6    Period Weeks    Status Achieved    Target Date 08/26/21      PT SHORT TERM GOAL #3   Title pt to demonstrate ability to lift 15# with good mechanics from ground level with improved breathing mechanics to decrease strain at pelvic floor    Time 6    Period Weeks    Status On-going    Target Date 08/26/21               PT Long Term Goals - 10/25/21 0938       PT LONG TERM GOAL #1   Title pt to be I advanced HEP    Time 3    Period Months    Status Achieved    Target Date 10/15/21      PT LONG TERM GOAL #2   Title pt to demonstrate at least 4/5 pelvic floor strength to improve pelvic stability for return to running  and decrease leakage symptoms.    Time 3    Period Months    Status Achieved    Target Date 10/15/21      PT LONG TERM GOAL #3   Title pt to demonstrate ability to lift 25# with good mechanics from ground level with improved breathing mechanics to decrease strain at pelvic floor    Time 3    Period Months    Status On-going    Target Date 10/15/21      PT LONG TERM GOAL #4   Title pt demonstrate 5/5 bil hip strength for improved pelvic stability for return to running    Time 3    Period Months    Status On-going    Target Date 10/15/21      PT LONG TERM GOAL #5   Title pt to be able to complete 30 mins of fast paced walking or jogging without leakage or prolapse symptoms or compensatory strategies for decrease risk of injury with return to running postpartum.    Time 3    Period Months    Status New    Target Date 01/22/22                   Plan - 10/25/21 1015     Clinical Impression Statement Pt presents to clinic reporting she has had no leakage or prolapse symptoms but does feel like she is not strong enough to return to running and feels like she still struggles with coordinating pelvic floor and core for activity to no strain downward at prolapse. Pt session focused on hip and core strengthening with emphasis being on core activiation and pelvic floor mobility during all exercises, also increased challenge of exercises with more single leg tasks for improved mechanics and tolerance for return to running. Pt tolerated well with cues for techniques as she still struggles with TA activation during tasks without cues. Pt continues to work toward therapy goals and demonstrate improvement however Pt would benefit from additional PT to further improve strength and mechanics to decrease risk of worsening prolapse.    Personal Factors  and Comorbidities Comorbidity 1    Comorbidities postpartum    Examination-Activity Limitations Continence;Hygiene/Grooming;Locomotion Level     Examination-Participation Restrictions Community Activity;Interpersonal Relationship    Stability/Clinical Decision Making Stable/Uncomplicated    Rehab Potential Good    PT Frequency 1x / week    PT Duration 12 weeks    PT Treatment/Interventions ADLs/Self Care Home Management;Aquatic Therapy;Functional mobility training;Therapeutic activities;Therapeutic exercise;Neuromuscular re-education;Manual techniques;Patient/family education;Scar mobilization;Passive range of motion;Energy conservation;Taping    PT Next Visit Plan strengthening, stretching thoracic spine, and pelvic floor coordination    PT Home Exercise Plan [id number]    Consulted and Agree with Plan of Care Patient             Patient will benefit from skilled therapeutic intervention in order to improve the following deficits and impairments:  Decreased coordination, Decreased endurance, Pain, Improper body mechanics, Impaired flexibility, Decreased mobility, Decreased strength, Postural dysfunction, Decreased scar mobility  Visit Diagnosis: Muscle weakness (generalized) - Plan: PT plan of care cert/re-cert  Unspecified lack of coordination - Plan: PT plan of care cert/re-cert  Abnormal posture - Plan: PT plan of care cert/re-cert     Problem List Patient Active Problem List   Diagnosis Date Noted   C. difficile diarrhea 05/25/2021   Postpartum endometritis 04/23/2021   Postpartum hemorrhage 04/06/2021   Elevated LFTs 11/14/2020   Obsessive-compulsive disorder 10/15/2020   Stacy Gardner, PT, DPT 10/25/2308:56 AM   [facility] @ [address] [place], [place], [address] Phone: [phone]   Fax:  [phone]  Name: Sarah Estrada MRN: [id number] Date of Birth: [birth year]

## 2021-10-25 NOTE — Patient Instructions (Signed)

## 2021-11-08 ENCOUNTER — Other Ambulatory Visit: Payer: Self-pay

## 2021-11-08 ENCOUNTER — Ambulatory Visit: Payer: 59 | Attending: Certified Nurse Midwife | Admitting: Physical Therapy

## 2021-11-08 ENCOUNTER — Encounter: Payer: Self-pay | Admitting: Physical Therapy

## 2021-11-08 DIAGNOSIS — M6281 Muscle weakness (generalized): Secondary | ICD-10-CM | POA: Insufficient documentation

## 2021-11-08 DIAGNOSIS — R279 Unspecified lack of coordination: Secondary | ICD-10-CM | POA: Diagnosis not present

## 2021-11-08 DIAGNOSIS — R293 Abnormal posture: Secondary | ICD-10-CM | POA: Diagnosis not present

## 2021-11-08 NOTE — Therapy (Signed)
[facility] ?[place], [place], [address] ?Phone: [phone]   Fax:  [phone] ? ?Physical Therapy Treatment ? ?Patient Details  ?Name: Sarah Estrada ?MRN: [id number] ?Date of Birth: 1980-10-29 ?Referring Provider (PT): Gabriel Carina, CNM ? ? ?Encounter Date: 11/08/2021 ? ? PT End of Session - 11/08/21 1454   ? ? Visit Number 8   ? Number of Visits 25   ? Date for PT Re-Evaluation 01/22/22   ? Authorization Type UMR choice   ? PT Start Time 2774   pt arrival time  ? PT Stop Time 1287   ? PT Time Calculation (min) 39 min   ? Activity Tolerance Patient tolerated treatment well   ? Behavior During Therapy [facility] for tasks assessed/performed   ? ?  ?  ? ?  ? ? ?Past Medical History:  ?Diagnosis Date  ? Hypothyroidism   ? OCD (obsessive compulsive disorder)   ? Supervision of high risk primigravida of advanced maternal age, antepartum 10/15/2020  ?  Nursing Staff Provider Office Location [facility] Dating  LMP Language  English Anatomy US  Normal Flu Vaccine  04/2020 Genetic Screen  NIPS: Low risk   AFP: Neg    Horizon 4: Neg TDaP Vaccine  01/26/21 Hgb A1C or  GTT Early Hg A1C 5.3 Third trimester WNL  COVID Vaccine Pfizer-All 3   LAB RESULTS  Rhogam  NA Blood Type AB/Positive/-- (02/18 1117)  Feeding Plan Breast Antibody Negative (02/18 1117) Con  ? ? ?Past Surgical History:  ?Procedure Laterality Date  ? WISDOM TOOTH EXTRACTION    ? ? ?There were no vitals filed for this visit. ? ? Subjective Assessment - 11/08/21 1455   ? ? Subjective Pt reports she did have one instance of urinary leakage with inability to make it in time to bathroom with first void of the AM; no longer having pain anywhere, and almost never has prolapse symptoms per pt.   ? Pertinent History vaginal birth, had succal tear and right episiotomy   ? How long can you sit comfortably? no limit   ? How long can you stand comfortably? no limit   ? How long can you walk comfortably? no  limit   ? Patient Stated Goals to return to running and have improved pelvic stability without discomfort.   ? Currently in Pain? No/denies   ? ?  ?  ? ?  ? ? ? ? ? ? ? ? ? ? ? ? ? ? ? ? ? ? ? ? [place] [facility] PT Treatment/Exercise - 11/08/21 0001   ? ?  ? Exercises  ? Exercises Knee/Hip;Ankle   ?  ? Lumbar Exercises: Standing  ? Functional Squats 20 reps   ? Functional Squats Limitations 2x10# DB; kegel with return to standing   ? Forward Lunge Limitations;10 reps   ? Forward Lunge Limitations each leg 2x10 DM with pelvic contraction with return to standing   ? Row Strengthening;Right;Left;20 reps   ? Row Limitations 10#   ? Other Standing Lumbar Exercises farmers carry 500' with 10# and 30# kettlebell cues for upright posture, single leg sit<>stand from lowest setting on mat table 2x10; jumping forward/backward x10; lateral jumping Rt/Lt x10; single leg calf raise x10 on Lt but unable on rt with ankle bothering her.   ? Other Standing Lumbar Exercises tall kneel squats 30# kettlebell 2x10   ?  ? Lumbar Exercises: Sidelying  ? Other Sidelying Lumbar Exercises hip  abduction 2x10 with ball press   ?  ? Lumbar Exercises: Quadruped  ? Other Quadruped Lumbar Exercises knee hovers with unilateral ball 2x10   ? ?  ?  ? ?  ? ? ? ? ? ? ? ? ? ? PT Education - 11/08/21 1541   ? ? Education Details Pt educated on continuing HEP, coordination with pelvic floor and exercise, mositurizers as needed externally a vulva with handout given last session and reeducated on this session   ? Person(s) Educated Patient   ? Methods Explanation;Demonstration;Tactile cues;Verbal cues;Handout   ? Comprehension Verbalized understanding;Returned demonstration   ? ?  ?  ? ?  ? ? ? PT Short Term Goals - 11/08/21 1545   ? ?  ? PT SHORT TERM GOAL #1  ? Title pt to be I HEP   ? Time 6   ? Period Weeks   ? Status Achieved   ? Target Date 08/26/21   ?  ? PT SHORT TERM GOAL #2  ? Title pt to demonstrate at least 3/5 pelvic floor strength to improve pelvic  stability for return to running and decrease leakage symptoms.   ? Time 6   ? Period Weeks   ? Status Achieved   ? Target Date 08/26/21   ?  ? PT SHORT TERM GOAL #3  ? Title pt to demonstrate ability to lift 15# with good mechanics from ground level with improved breathing mechanics to decrease strain at pelvic floor   ? Time 6   ? Period Weeks   ? Status Achieved   ? Target Date 08/26/21   ? ?  ?  ? ?  ? ? ? ? PT Long Term Goals - 11/08/21 1545   ? ?  ? PT LONG TERM GOAL #1  ? Title pt to be I advanced HEP   ? Time 3   ? Period Months   ? Status Achieved   ? Target Date 10/15/21   ?  ? PT LONG TERM GOAL #2  ? Title pt to demonstrate at least 4/5 pelvic floor strength to improve pelvic stability for return to running and decrease leakage symptoms.   ? Time 3   ? Period Months   ? Status Achieved   ? Target Date 10/15/21   ?  ? PT LONG TERM GOAL #3  ? Title pt to demonstrate ability to lift 25# with good mechanics from ground level with improved breathing mechanics to decrease strain at pelvic floor   ? Time 3   ? Period Months   ? Status Achieved   ? Target Date 10/15/21   ?  ? PT LONG TERM GOAL #4  ? Title pt demonstrate 5/5 bil hip strength for improved pelvic stability for return to running   ? Time 3   ? Period Months   ? Status Achieved   ? Target Date 10/15/21   ?  ? PT LONG TERM GOAL #5  ? Title pt to be able to complete 30 mins of fast paced walking or jogging without leakage or prolapse symptoms or compensatory strategies for decrease risk of injury with return to running postpartum.   ? Time 3   ? Period Months   ? Status Achieved   ? Target Date 01/22/22   ? ?  ?  ? ?  ? ? ? ? ? ? ? ? Plan - 11/08/21 1542   ? ? Clinical Impression Statement Pt presents to clinic reporting she has  only had very mild leakage very infrequently (once in the past month) with not able to make it to bathroom quickly enough with first void in AM. Pt no longer has prolapse, demonstrates improved core activation and breathing  mechanics, does think she has some tightness at episiotomy site recently noted with intercourse, reeducated to continue scar mobility and pt agreed and demonstrated with model good recall. Pt tolerated session well with strengthening exercises and cues for pelvic floor and breathing. Pt denied additional questions or concerns at this time and ready for DC.   ? Personal Factors and Comorbidities Comorbidity 1   ? Comorbidities postpartum   ? Examination-Activity Limitations Continence;Hygiene/Grooming;Locomotion Level   ? Examination-Participation Restrictions Community Activity;Interpersonal Relationship   ? Stability/Clinical Decision Making Stable/Uncomplicated   ? Rehab Potential Good   ? PT Frequency 1x / week   ? PT Duration 12 weeks   ? PT Treatment/Interventions ADLs/Self Care Home Management;Aquatic Therapy;Functional mobility training;Therapeutic activities;Therapeutic exercise;Neuromuscular re-education;Manual techniques;Patient/family education;Scar mobilization;Passive range of motion;Energy conservation;Taping   ? PT Home Exercise Plan [id number]   ? Consulted and Agree with Plan of Care Patient   ? ?  ?  ? ?  ? ? ?Patient will benefit from skilled therapeutic intervention in order to improve the following deficits and impairments:  Decreased coordination, Decreased endurance, Pain, Improper body mechanics, Impaired flexibility, Decreased mobility, Decreased strength, Postural dysfunction, Decreased scar mobility ? ?Visit Diagnosis: ?Muscle weakness (generalized) ? ?Unspecified lack of coordination ? ?Abnormal posture ? ? ? ? ?Problem List ?Patient Active Problem List  ? Diagnosis Date Noted  ? C. difficile diarrhea 05/25/2021  ? Postpartum endometritis 04/23/2021  ? Postpartum hemorrhage 04/06/2021  ? Elevated LFTs 11/14/2020  ? Obsessive-compulsive disorder 10/15/2020  ? ? ?PHYSICAL THERAPY DISCHARGE SUMMARY ? ?Visits from Start of Care: 8 ? ?Current functional level related to goals / functional  outcomes: ?All goals met  ?  ?Remaining deficits: ?Pt reports very infrequent urinary leakage with inability to make it bathroom once in last month but other than this no leakage, no prolapse symptoms, no pain, doe

## 2021-11-22 ENCOUNTER — Ambulatory Visit: Payer: 59 | Admitting: Physical Therapy

## 2021-12-14 ENCOUNTER — Other Ambulatory Visit (HOSPITAL_COMMUNITY): Payer: Self-pay

## 2021-12-14 DIAGNOSIS — Z862 Personal history of diseases of the blood and blood-forming organs and certain disorders involving the immune mechanism: Secondary | ICD-10-CM | POA: Diagnosis not present

## 2021-12-14 DIAGNOSIS — Z79899 Other long term (current) drug therapy: Secondary | ICD-10-CM | POA: Diagnosis not present

## 2021-12-14 DIAGNOSIS — R300 Dysuria: Secondary | ICD-10-CM | POA: Diagnosis not present

## 2021-12-14 DIAGNOSIS — F429 Obsessive-compulsive disorder, unspecified: Secondary | ICD-10-CM | POA: Diagnosis not present

## 2021-12-14 DIAGNOSIS — E039 Hypothyroidism, unspecified: Secondary | ICD-10-CM | POA: Diagnosis not present

## 2021-12-14 DIAGNOSIS — Z6826 Body mass index (BMI) 26.0-26.9, adult: Secondary | ICD-10-CM | POA: Diagnosis not present

## 2021-12-14 DIAGNOSIS — E559 Vitamin D deficiency, unspecified: Secondary | ICD-10-CM | POA: Diagnosis not present

## 2021-12-14 DIAGNOSIS — E7800 Pure hypercholesterolemia, unspecified: Secondary | ICD-10-CM | POA: Diagnosis not present

## 2021-12-14 DIAGNOSIS — R7303 Prediabetes: Secondary | ICD-10-CM | POA: Diagnosis not present

## 2021-12-14 MED ORDER — ESCITALOPRAM OXALATE 20 MG PO TABS
20.0000 mg | ORAL_TABLET | Freq: Every day | ORAL | 1 refills | Status: DC
  Filled 2021-12-14: qty 90, 90d supply, fill #0
  Filled 2022-03-09: qty 90, 90d supply, fill #1

## 2021-12-16 ENCOUNTER — Other Ambulatory Visit (HOSPITAL_COMMUNITY): Payer: Self-pay

## 2022-01-17 DIAGNOSIS — R300 Dysuria: Secondary | ICD-10-CM | POA: Diagnosis not present

## 2022-01-17 DIAGNOSIS — Z79899 Other long term (current) drug therapy: Secondary | ICD-10-CM | POA: Diagnosis not present

## 2022-01-17 DIAGNOSIS — R3915 Urgency of urination: Secondary | ICD-10-CM | POA: Diagnosis not present

## 2022-03-07 DIAGNOSIS — R3915 Urgency of urination: Secondary | ICD-10-CM | POA: Diagnosis not present

## 2022-03-07 DIAGNOSIS — R300 Dysuria: Secondary | ICD-10-CM | POA: Diagnosis not present

## 2022-03-09 ENCOUNTER — Other Ambulatory Visit (HOSPITAL_COMMUNITY): Payer: Self-pay

## 2022-03-15 ENCOUNTER — Other Ambulatory Visit (HOSPITAL_COMMUNITY): Payer: Self-pay

## 2022-03-15 MED ORDER — LEVOTHYROXINE SODIUM 75 MCG PO TABS
ORAL_TABLET | ORAL | 3 refills | Status: AC
  Filled 2022-03-15: qty 90, 90d supply, fill #0
  Filled 2022-06-03: qty 90, 90d supply, fill #1

## 2022-05-30 DIAGNOSIS — Z833 Family history of diabetes mellitus: Secondary | ICD-10-CM | POA: Diagnosis not present

## 2022-05-30 DIAGNOSIS — E559 Vitamin D deficiency, unspecified: Secondary | ICD-10-CM | POA: Diagnosis not present

## 2022-05-30 DIAGNOSIS — Z6826 Body mass index (BMI) 26.0-26.9, adult: Secondary | ICD-10-CM | POA: Diagnosis not present

## 2022-05-30 DIAGNOSIS — E7800 Pure hypercholesterolemia, unspecified: Secondary | ICD-10-CM | POA: Diagnosis not present

## 2022-05-30 DIAGNOSIS — Z862 Personal history of diseases of the blood and blood-forming organs and certain disorders involving the immune mechanism: Secondary | ICD-10-CM | POA: Diagnosis not present

## 2022-05-30 DIAGNOSIS — E039 Hypothyroidism, unspecified: Secondary | ICD-10-CM | POA: Diagnosis not present

## 2022-06-03 ENCOUNTER — Other Ambulatory Visit (HOSPITAL_COMMUNITY): Payer: Self-pay

## 2022-06-05 ENCOUNTER — Other Ambulatory Visit (HOSPITAL_COMMUNITY): Payer: Self-pay

## 2022-06-09 ENCOUNTER — Other Ambulatory Visit (HOSPITAL_COMMUNITY): Payer: Self-pay

## 2022-06-21 ENCOUNTER — Encounter (HOSPITAL_COMMUNITY): Payer: Self-pay | Admitting: Pharmacist

## 2022-06-21 ENCOUNTER — Other Ambulatory Visit (HOSPITAL_COMMUNITY): Payer: Self-pay

## 2022-06-21 MED ORDER — ESCITALOPRAM OXALATE 20 MG PO TABS
20.0000 mg | ORAL_TABLET | Freq: Every day | ORAL | 3 refills | Status: DC
  Filled 2022-06-21: qty 90, 90d supply, fill #0
  Filled 2022-09-14: qty 90, 90d supply, fill #1
  Filled 2023-02-01: qty 90, 90d supply, fill #2
  Filled 2023-04-29: qty 90, 90d supply, fill #3

## 2022-06-21 MED ORDER — LEVOTHYROXINE SODIUM 75 MCG PO TABS
75.0000 ug | ORAL_TABLET | Freq: Every morning | ORAL | 3 refills | Status: DC
  Filled 2022-06-21 – 2022-09-14 (×2): qty 90, 90d supply, fill #0
  Filled 2022-12-08: qty 90, 90d supply, fill #1
  Filled 2023-03-15: qty 90, 90d supply, fill #2
  Filled 2023-06-06: qty 90, 90d supply, fill #3

## 2022-08-23 DIAGNOSIS — H66001 Acute suppurative otitis media without spontaneous rupture of ear drum, right ear: Secondary | ICD-10-CM | POA: Diagnosis not present

## 2022-08-23 DIAGNOSIS — J028 Acute pharyngitis due to other specified organisms: Secondary | ICD-10-CM | POA: Diagnosis not present

## 2022-08-23 DIAGNOSIS — J00 Acute nasopharyngitis [common cold]: Secondary | ICD-10-CM | POA: Diagnosis not present

## 2022-09-14 ENCOUNTER — Other Ambulatory Visit (HOSPITAL_COMMUNITY): Payer: Self-pay

## 2022-09-14 ENCOUNTER — Other Ambulatory Visit: Payer: Self-pay

## 2022-10-24 DIAGNOSIS — R3982 Chronic bladder pain: Secondary | ICD-10-CM | POA: Diagnosis not present

## 2022-10-24 DIAGNOSIS — R3915 Urgency of urination: Secondary | ICD-10-CM | POA: Diagnosis not present

## 2022-10-24 DIAGNOSIS — R1031 Right lower quadrant pain: Secondary | ICD-10-CM | POA: Diagnosis not present

## 2022-10-24 DIAGNOSIS — R102 Pelvic and perineal pain: Secondary | ICD-10-CM | POA: Diagnosis not present

## 2022-10-24 DIAGNOSIS — R1032 Left lower quadrant pain: Secondary | ICD-10-CM | POA: Diagnosis not present

## 2022-10-24 DIAGNOSIS — R300 Dysuria: Secondary | ICD-10-CM | POA: Diagnosis not present

## 2022-11-01 DIAGNOSIS — R3915 Urgency of urination: Secondary | ICD-10-CM | POA: Diagnosis not present

## 2022-11-01 DIAGNOSIS — N200 Calculus of kidney: Secondary | ICD-10-CM | POA: Diagnosis not present

## 2022-11-01 DIAGNOSIS — R102 Pelvic and perineal pain: Secondary | ICD-10-CM | POA: Diagnosis not present

## 2022-11-01 DIAGNOSIS — R109 Unspecified abdominal pain: Secondary | ICD-10-CM | POA: Diagnosis not present

## 2022-11-01 DIAGNOSIS — R1031 Right lower quadrant pain: Secondary | ICD-10-CM | POA: Diagnosis not present

## 2022-11-01 DIAGNOSIS — R1032 Left lower quadrant pain: Secondary | ICD-10-CM | POA: Diagnosis not present

## 2022-11-28 DIAGNOSIS — R3915 Urgency of urination: Secondary | ICD-10-CM | POA: Diagnosis not present

## 2022-11-28 DIAGNOSIS — R3982 Chronic bladder pain: Secondary | ICD-10-CM | POA: Diagnosis not present

## 2022-12-08 ENCOUNTER — Other Ambulatory Visit (HOSPITAL_COMMUNITY): Payer: Self-pay

## 2023-01-08 DIAGNOSIS — Z6826 Body mass index (BMI) 26.0-26.9, adult: Secondary | ICD-10-CM | POA: Diagnosis not present

## 2023-01-08 DIAGNOSIS — E559 Vitamin D deficiency, unspecified: Secondary | ICD-10-CM | POA: Diagnosis not present

## 2023-01-08 DIAGNOSIS — E7800 Pure hypercholesterolemia, unspecified: Secondary | ICD-10-CM | POA: Diagnosis not present

## 2023-01-08 DIAGNOSIS — N939 Abnormal uterine and vaginal bleeding, unspecified: Secondary | ICD-10-CM | POA: Diagnosis not present

## 2023-01-08 DIAGNOSIS — R4586 Emotional lability: Secondary | ICD-10-CM | POA: Diagnosis not present

## 2023-01-08 DIAGNOSIS — R7303 Prediabetes: Secondary | ICD-10-CM | POA: Diagnosis not present

## 2023-01-08 DIAGNOSIS — E039 Hypothyroidism, unspecified: Secondary | ICD-10-CM | POA: Diagnosis not present

## 2023-01-17 DIAGNOSIS — N939 Abnormal uterine and vaginal bleeding, unspecified: Secondary | ICD-10-CM | POA: Diagnosis not present

## 2023-02-02 ENCOUNTER — Other Ambulatory Visit: Payer: Self-pay

## 2023-02-20 ENCOUNTER — Other Ambulatory Visit: Payer: Self-pay

## 2023-02-20 ENCOUNTER — Other Ambulatory Visit (HOSPITAL_COMMUNITY): Payer: Self-pay

## 2023-02-20 DIAGNOSIS — R300 Dysuria: Secondary | ICD-10-CM | POA: Diagnosis not present

## 2023-02-20 DIAGNOSIS — R1031 Right lower quadrant pain: Secondary | ICD-10-CM | POA: Diagnosis not present

## 2023-02-20 DIAGNOSIS — R3982 Chronic bladder pain: Secondary | ICD-10-CM | POA: Diagnosis not present

## 2023-02-20 DIAGNOSIS — R3915 Urgency of urination: Secondary | ICD-10-CM | POA: Diagnosis not present

## 2023-02-20 MED ORDER — OXYBUTYNIN CHLORIDE ER 5 MG PO TB24
5.0000 mg | ORAL_TABLET | Freq: Every day | ORAL | 1 refills | Status: DC
  Filled 2023-02-20: qty 30, 30d supply, fill #0
  Filled 2023-04-12: qty 30, 30d supply, fill #1

## 2023-03-07 IMAGING — US US ABDOMEN LIMITED RUQ/ASCITES
1 series · 14 of 25 positions shown · non-contrast
Comparison: None.

CLINICAL DATA: Eighteen weeks pregnant with elevated LFTs

EXAM:
ULTRASOUND ABDOMEN LIMITED RIGHT UPPER QUADRANT

[Series 1: us abdomen limited ruq/ascites · 14 of 49 slices shown]
[im 1/49]
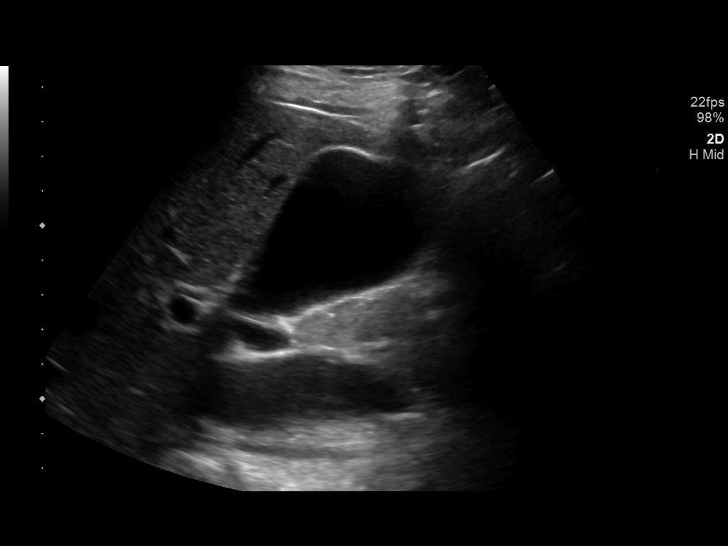
[im 5/49]
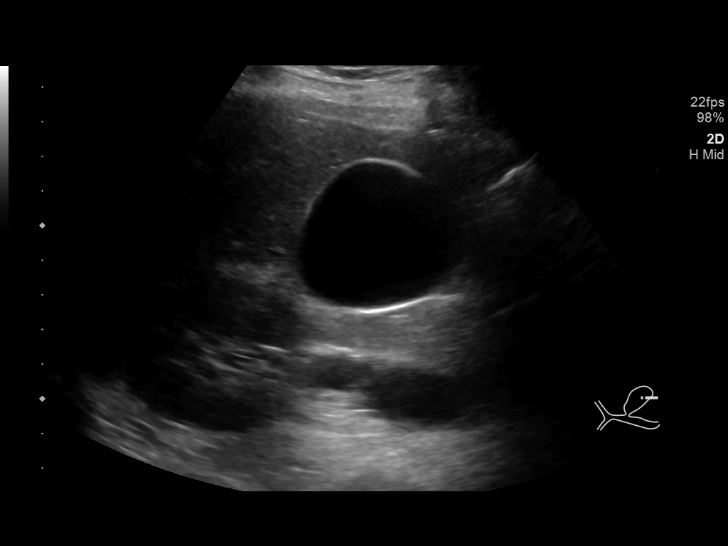
[im 9/49]
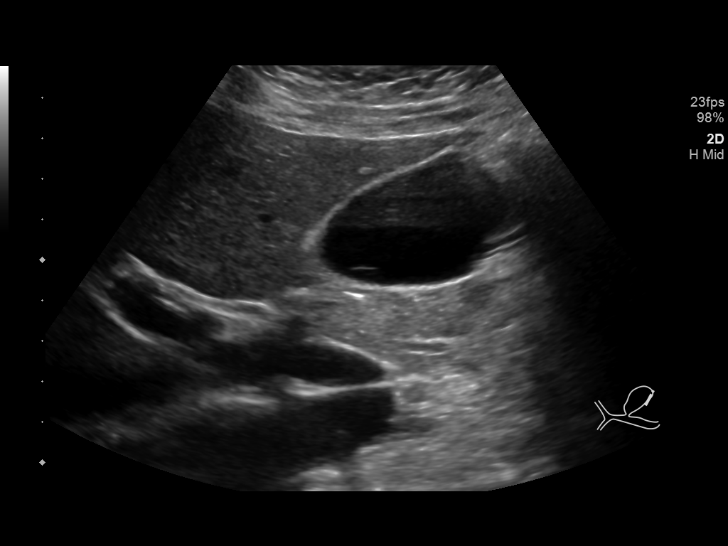
[im 13/49]
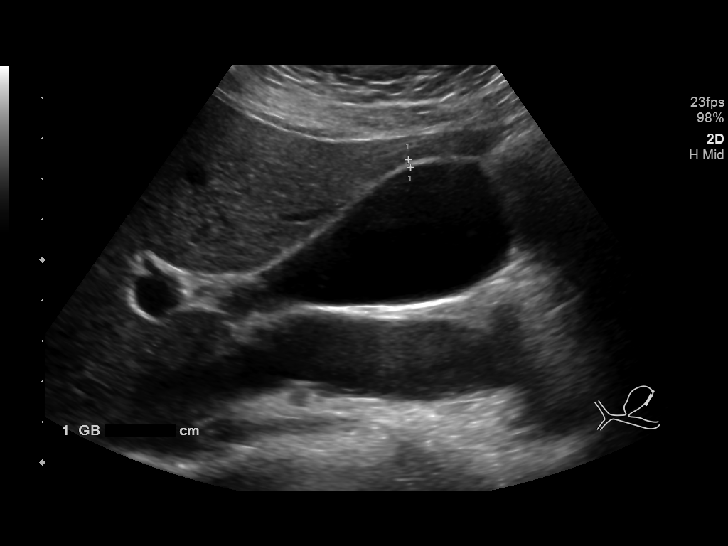
[im 17/49]
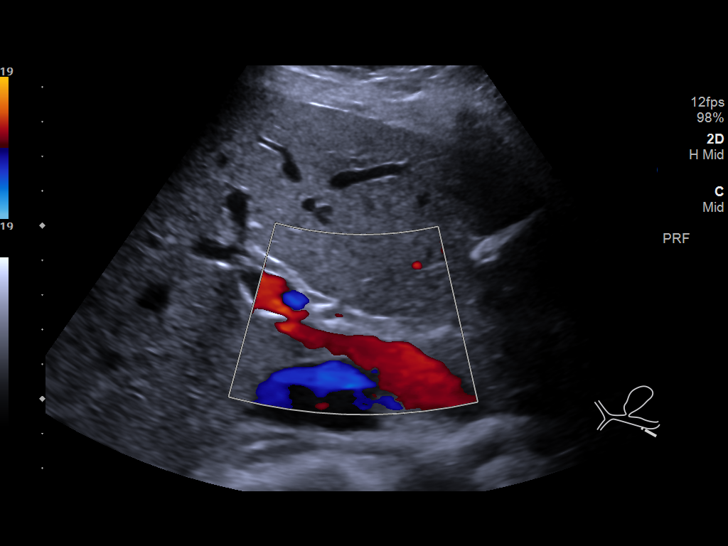
[im 19/49]
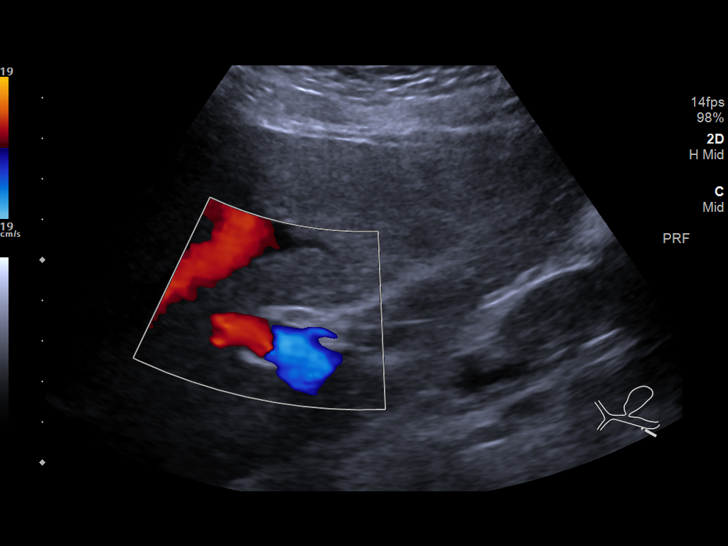
[im 23/49]
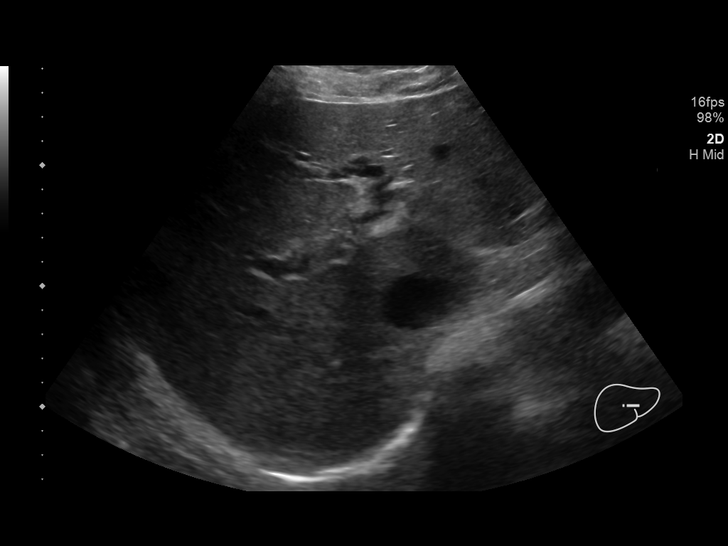
[im 27/49]
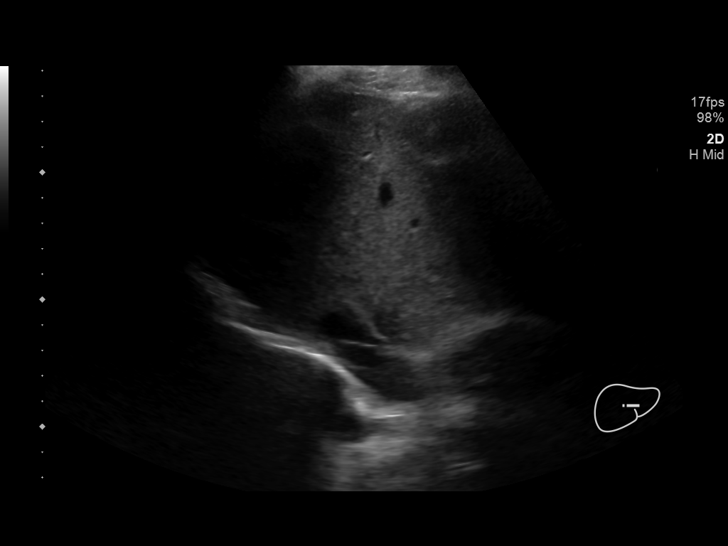
[im 31/49]
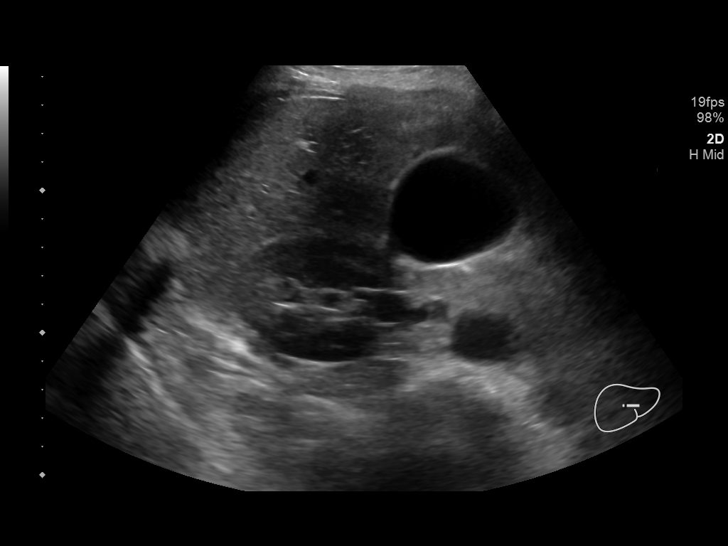
[im 33/49]
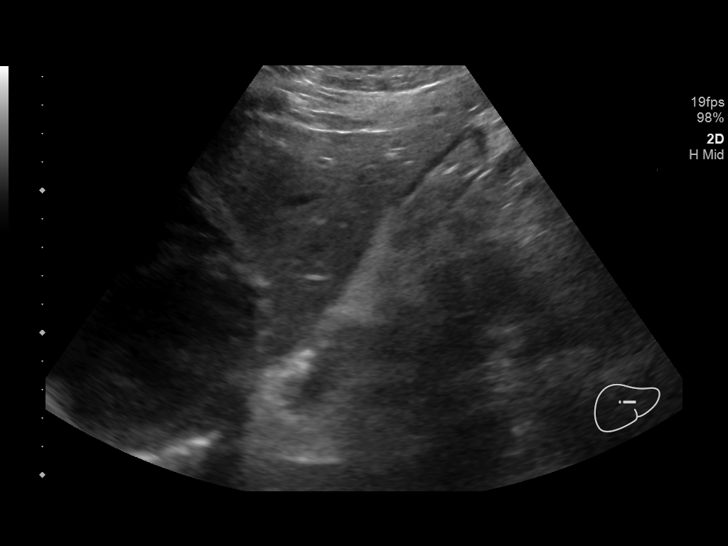
[im 37/49]
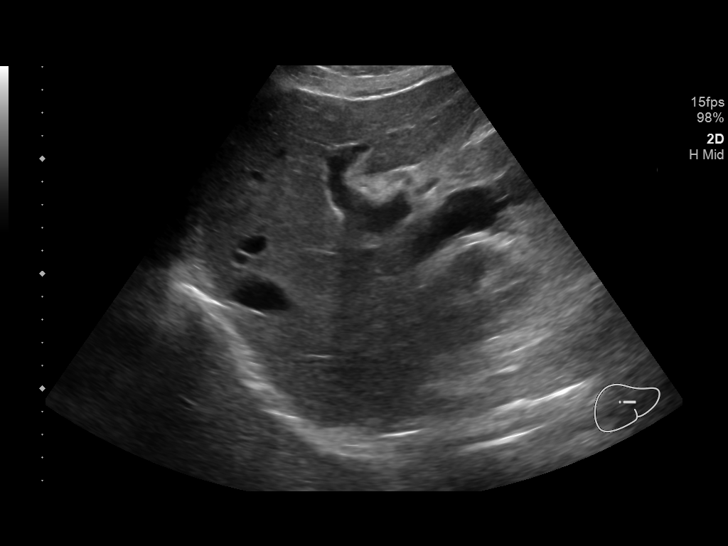
[im 41/49]
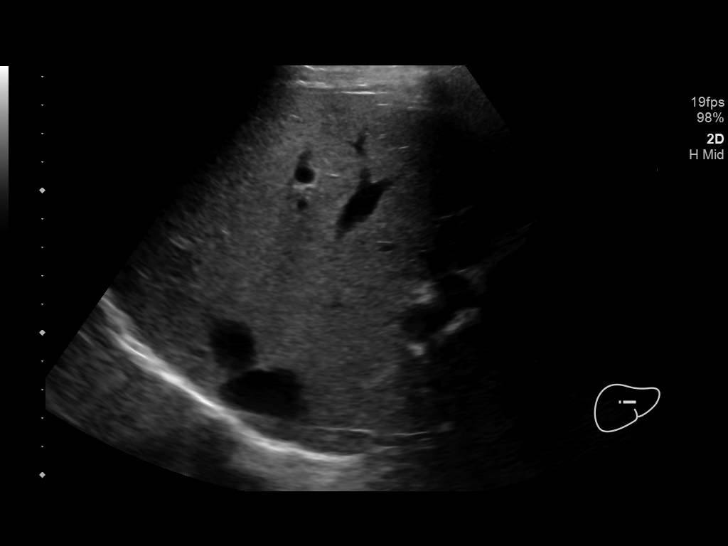
[im 45/49]
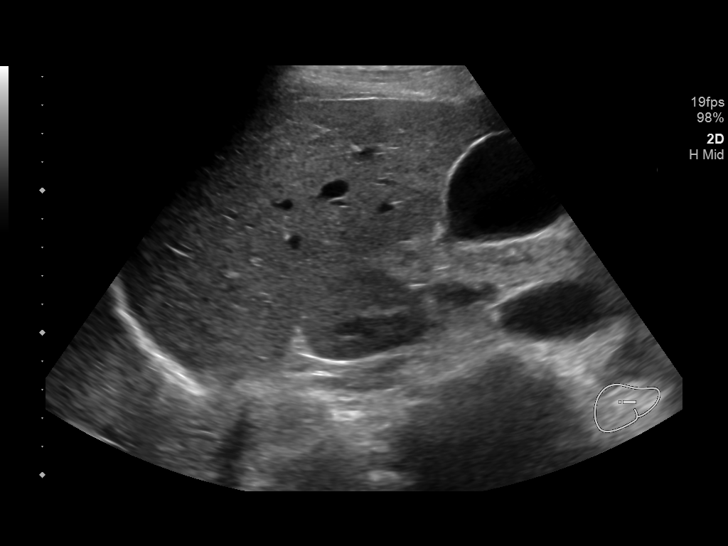
[im 49/49]
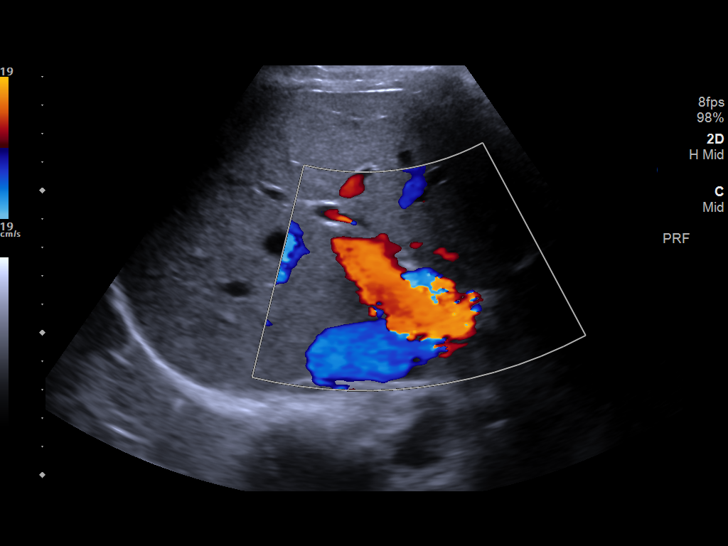

[14 of 25 positions shown; findings below may reference images not displayed]

FINDINGS: Gallbladder:

No gallstones or wall thickening visualized. No sonographic Murphy
sign noted by sonographer.

Common bile duct:

Diameter: 4 mm

Liver:

No focal lesion identified. Within normal limits in parenchymal
echogenicity. Portal vein is patent on color Doppler imaging with
normal direction of blood flow towards the liver.

Other: None.
IMPRESSION: Unremarkable right upper quadrant ultrasound.

## 2023-03-13 IMAGING — US US MFM OB DETAIL+14 WK
1 series · 13 of 28 positions shown · non-contrast
Comparison: none

[Series 1: us mfm ob detail+14 wk · 155 acquisitions, 13 frames shown]
[im 6/155]
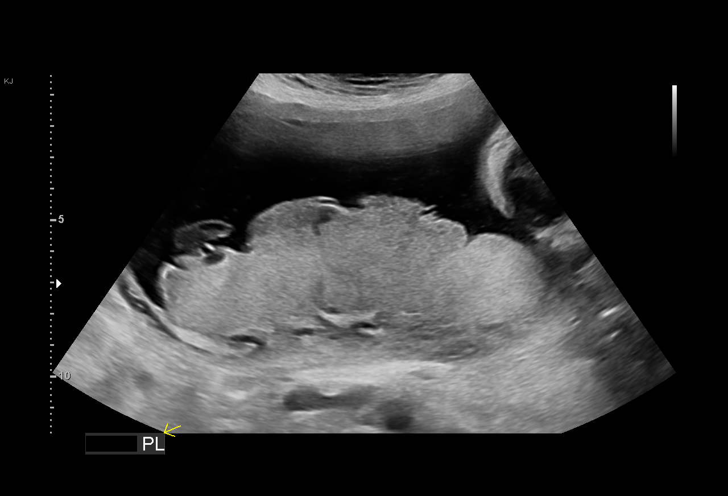
[im 18/155]
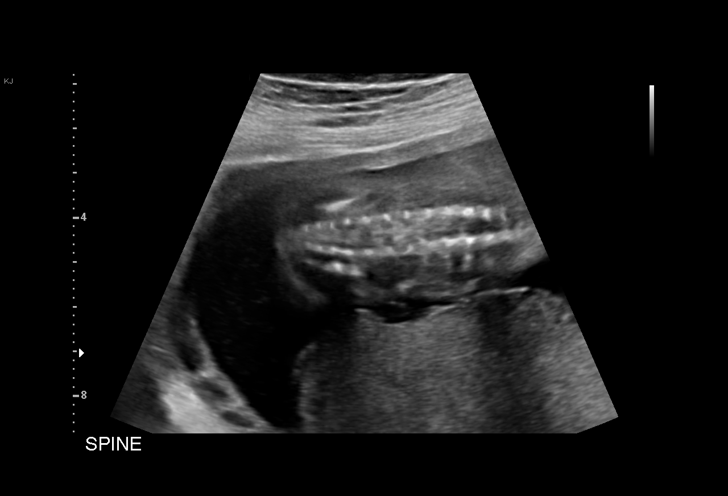
[im 29/155]
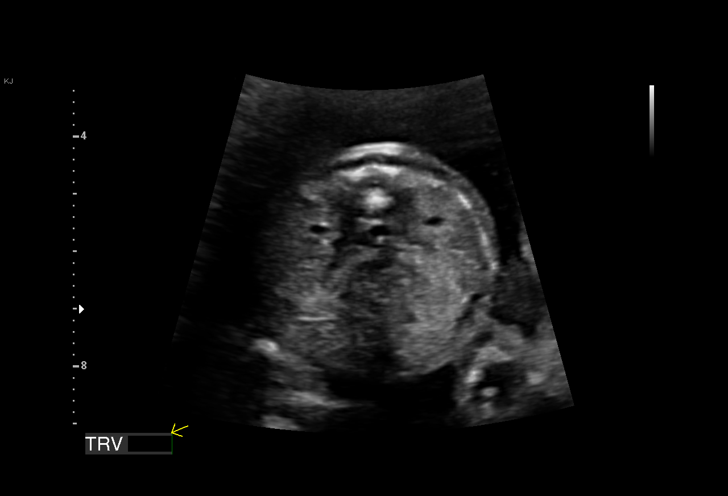
[im 40/155]
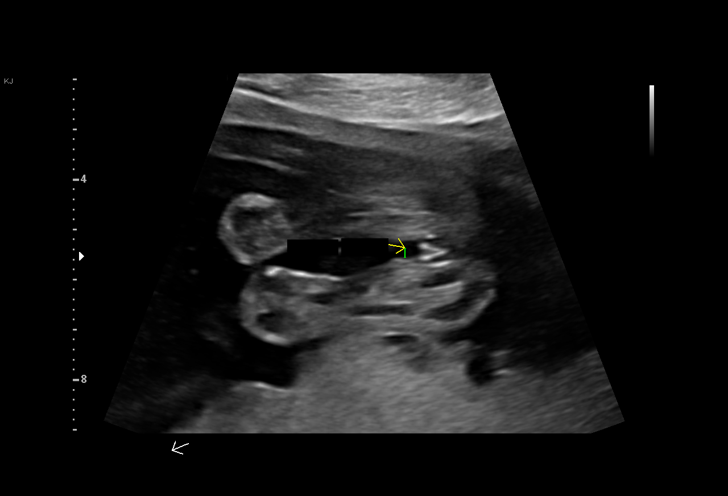
[im 52/155]
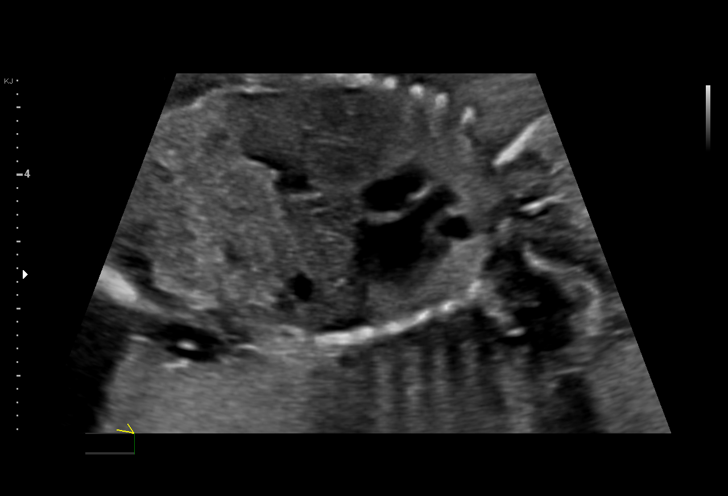
[im 63/155]
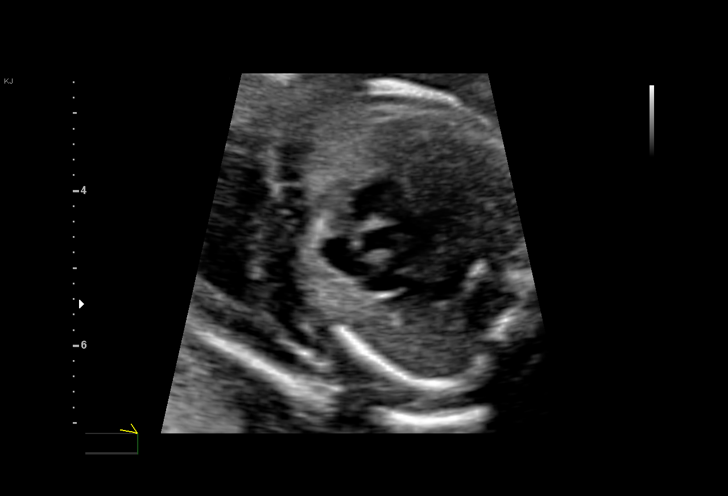
[im 80/155]
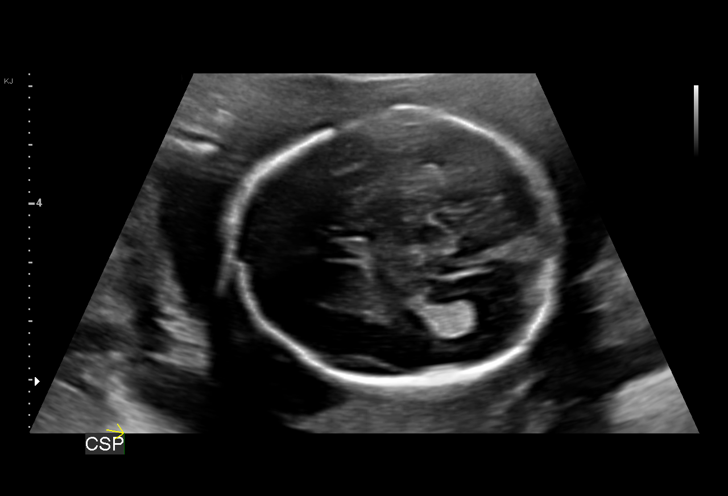
[im 92/155]
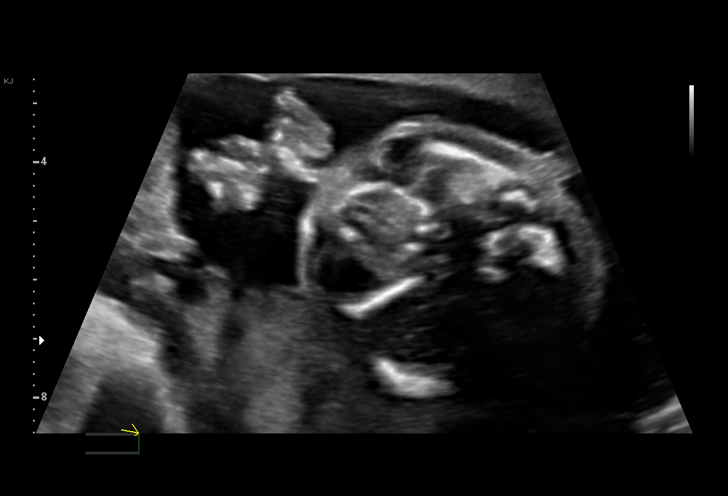
[im 103/155]
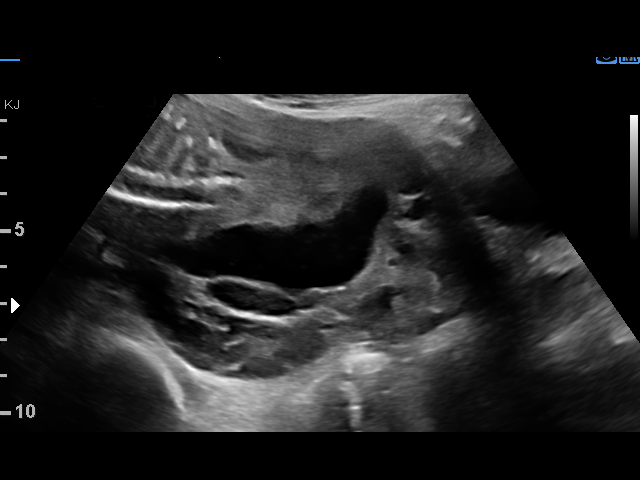
[im 115/155]
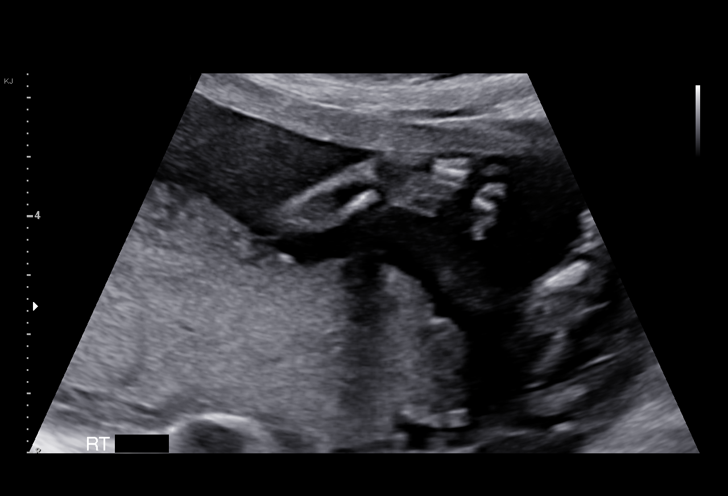
[im 126/155]
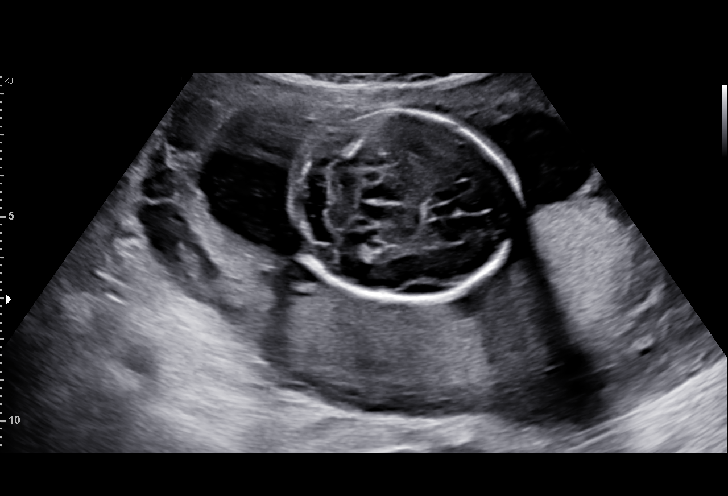
[im 137/155]
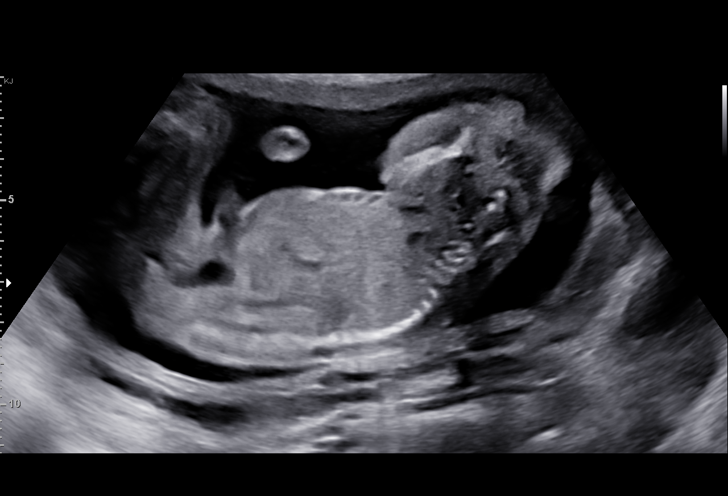
[im 149/155]
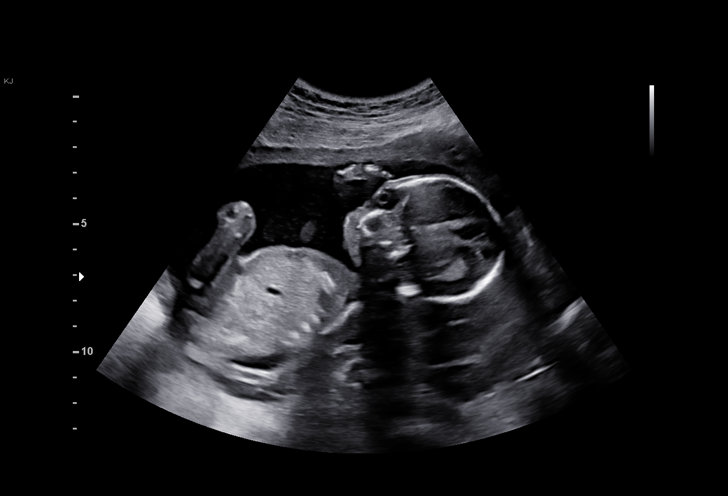

[13 of 28 positions shown; findings below may reference images not displayed]

Indications

 Advanced maternal age multigravida 35+,
 second trimester
 Hypothyroid - synthroid
 LR NIPS, Neg AFP
 19 weeks gestation of pregnancy
Fetal Evaluation

 Num Of Fetuses:         1
 Fetal Heart Rate(bpm):  138
 Cardiac Activity:       Observed
 Presentation:           Cephalic
 Placenta:               Posterior
 P. Cord Insertion:      Marginal insertion

 Amniotic Fluid
 AFI FV:      Within normal limits
Biometry

 BPD:      45.3  mm     G. Age:  19w 5d         74  %    CI:        73.72   %    70 - 86
                                                         FL/HC:      18.0   %    16.1 -
 HC:      167.6  mm     G. Age:  19w 3d         57  %    HC/AC:      1.18        1.09 -
 AC:      142.3  mm     G. Age:  19w 4d         61  %    FL/BPD:     66.7   %
 FL:       30.2  mm     G. Age:  19w 2d         50  %    FL/AC:      21.2   %    20 - 24
 HUM:      31.2  mm     G. Age:  20w 3d         83  %
 CER:        19  mm     G. Age:  18w 4d         18  %
 NFT:       4.2  mm
 LV:        5.4  mm
 CM:        4.7  mm

 Est. FW:     295  gm    0 lb 10 oz      66  %
OB History

 Gravidity:    1
Gestational Age

 LMP:           19w 1d        Date:  07/12/20                 EDD:   04/18/21
 U/S Today:     19w 4d                                        EDD:   04/15/21
 Best:          19w 1d     Det. By:  LMP  (07/12/20)          EDD:   04/18/21
Anatomy

 Cranium:               Appears normal         Aortic Arch:            Appears normal
 Cavum:                 Appears normal         Ductal Arch:            Appears normal
 Ventricles:            Appears normal         Diaphragm:              Appears normal
 Choroid Plexus:        Appears normal         Stomach:                Appears normal, left
                                                                       sided
 Cerebellum:            Appears normal         Abdomen:                Appears normal
 Posterior Fossa:       Appears normal         Abdominal Wall:         Appears nml (cord
                                                                       insert, abd wall)
 Nuchal Fold:           Appears normal         Cord Vessels:           Appears normal (3
                                                                       vessel cord)
 Face:                  Appears normal         Kidneys:                Appear normal
                        (orbits and profile)
 Lips:                  Appears normal         Bladder:                Appears normal
 Thoracic:              Appears normal         Spine:                  Appears normal
 Heart:                 Appears normal         Upper Extremities:      Appears normal
                        (4CH, axis, and
                        situs)
 RVOT:                  Appears normal         Lower Extremities:      Appears normal
 LVOT:                  Appears normal

 Other:  Fetus appears to be female. VC, 3VV and 3VTV visualized. Heels
         and 5th digit visualized. Nasal bone visualized.
Cervix Uterus Adnexa

 Cervix
 Length:           3.22  cm.
 Normal appearance by transabdominal scan.

 Right Ovary
 Within normal limits.

 Left Ovary
 Within normal limits.

 Adnexa
 No abnormality visualized.
Comments

 This patient was seen for a detailed fetal anatomy scan due
 to advanced maternal age.
 She denies any significant past medical history and denies
 any problems in her current pregnancy.
 She had a cell free DNA test earlier in her pregnancy which
 indicated a low risk for trisomy 21, 18, and 13. A female fetus
 is predicted.
 She was informed that the fetal growth and amniotic fluid
 level were appropriate for her gestational age.
 There were no obvious fetal anomalies noted on today's
 ultrasound exam.
 The patient was informed that anomalies may be missed due
 to technical limitations. If the fetus is in a suboptimal position
 or maternal habitus is increased, visualization of the fetus in
 the maternal uterus may be impaired.
 A marginal placental cord insertion was noted on today's
 exam.  The small association of fetal growth issues
 associated with a marginal placental cord insertion was
 discussed.
 The increased risk of fetal aneuploidy due to advanced
 maternal age was discussed. Due to advanced maternal age,
 the patient was offered and declined an amniocentesis today
 for definitive diagnosis of fetal aneuploidy.
 A follow-up exam was scheduled in 5 weeks to assess the
 fetal growth.

## 2023-03-15 ENCOUNTER — Other Ambulatory Visit: Payer: Self-pay

## 2023-03-16 DIAGNOSIS — F422 Mixed obsessional thoughts and acts: Secondary | ICD-10-CM | POA: Diagnosis not present

## 2023-03-23 DIAGNOSIS — F422 Mixed obsessional thoughts and acts: Secondary | ICD-10-CM | POA: Diagnosis not present

## 2023-03-26 DIAGNOSIS — N200 Calculus of kidney: Secondary | ICD-10-CM | POA: Diagnosis not present

## 2023-03-26 DIAGNOSIS — R102 Pelvic and perineal pain: Secondary | ICD-10-CM | POA: Diagnosis not present

## 2023-03-30 DIAGNOSIS — F422 Mixed obsessional thoughts and acts: Secondary | ICD-10-CM | POA: Diagnosis not present

## 2023-04-12 ENCOUNTER — Other Ambulatory Visit (HOSPITAL_COMMUNITY): Payer: Self-pay

## 2023-04-13 DIAGNOSIS — F422 Mixed obsessional thoughts and acts: Secondary | ICD-10-CM | POA: Diagnosis not present

## 2023-04-27 DIAGNOSIS — F422 Mixed obsessional thoughts and acts: Secondary | ICD-10-CM | POA: Diagnosis not present

## 2023-04-29 ENCOUNTER — Other Ambulatory Visit (HOSPITAL_COMMUNITY): Payer: Self-pay

## 2023-05-08 ENCOUNTER — Other Ambulatory Visit (HOSPITAL_COMMUNITY): Payer: Self-pay

## 2023-05-08 DIAGNOSIS — R3982 Chronic bladder pain: Secondary | ICD-10-CM | POA: Diagnosis not present

## 2023-05-08 DIAGNOSIS — R300 Dysuria: Secondary | ICD-10-CM | POA: Diagnosis not present

## 2023-05-08 DIAGNOSIS — R1032 Left lower quadrant pain: Secondary | ICD-10-CM | POA: Diagnosis not present

## 2023-05-08 DIAGNOSIS — R1031 Right lower quadrant pain: Secondary | ICD-10-CM | POA: Diagnosis not present

## 2023-05-08 DIAGNOSIS — R3915 Urgency of urination: Secondary | ICD-10-CM | POA: Diagnosis not present

## 2023-05-08 DIAGNOSIS — R102 Pelvic and perineal pain: Secondary | ICD-10-CM | POA: Diagnosis not present

## 2023-05-08 MED ORDER — OXYBUTYNIN CHLORIDE ER 5 MG PO TB24: ORAL_TABLET | ORAL | 1 refills | Status: AC

## 2023-05-09 ENCOUNTER — Other Ambulatory Visit (HOSPITAL_COMMUNITY): Payer: Self-pay

## 2023-05-11 DIAGNOSIS — F422 Mixed obsessional thoughts and acts: Secondary | ICD-10-CM | POA: Diagnosis not present

## 2023-06-06 ENCOUNTER — Other Ambulatory Visit: Payer: Self-pay

## 2023-06-22 IMAGING — US US MFM OB FOLLOW-UP
1 series · 14 of 28 positions shown · non-contrast
Comparison: none

[Series 1: us mfm ob follow-up · 28 acquisitions, 14 frames shown]
[im 2/28]
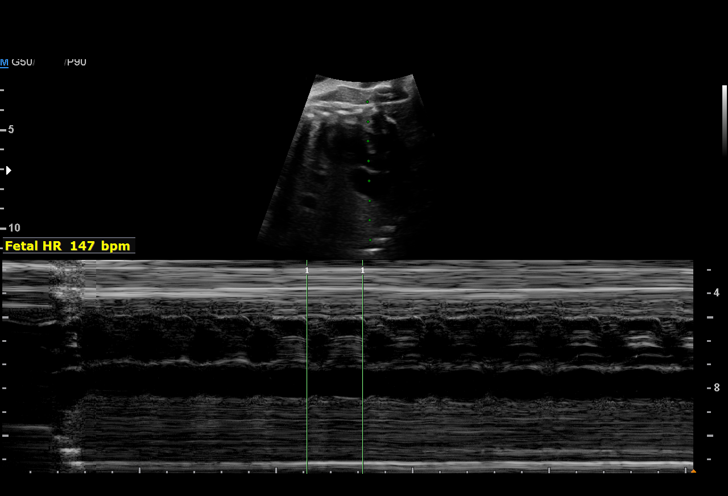
[im 4/28]
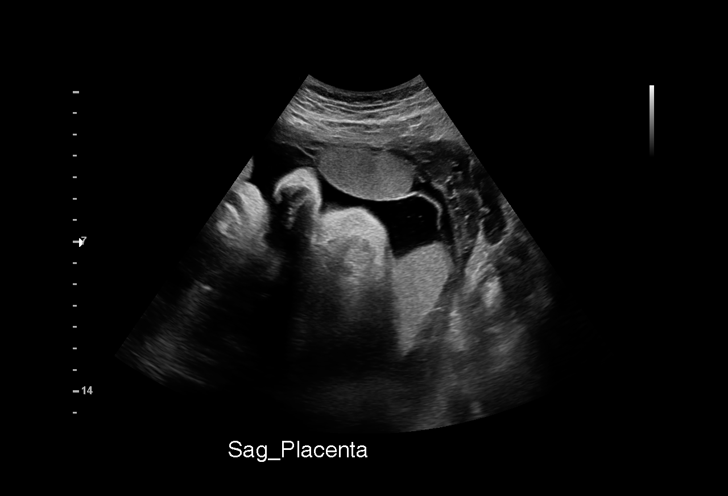
[im 6/28]
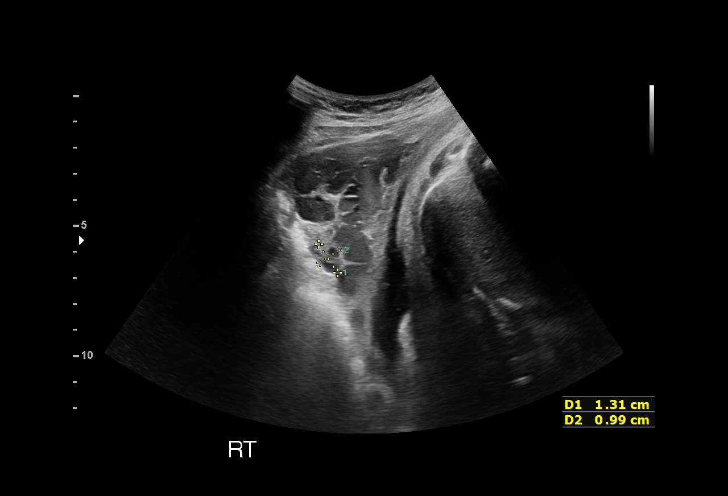
[im 8/28]
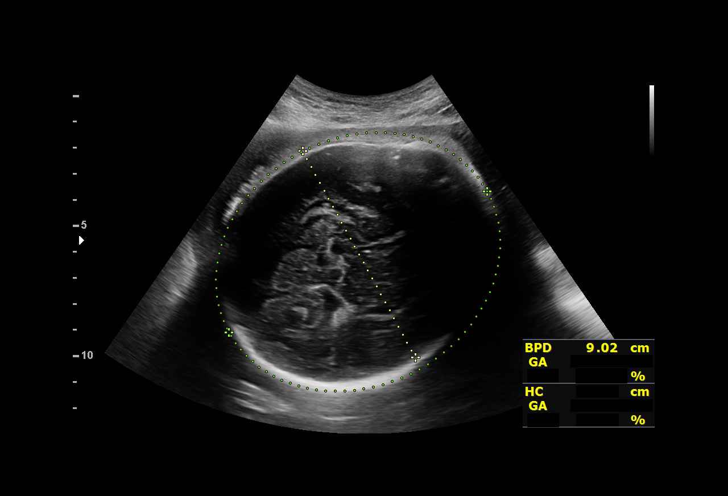
[im 10/28]
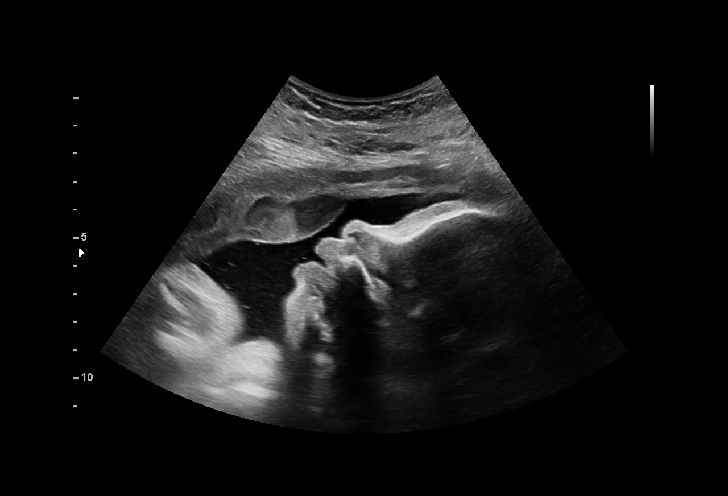
[im 12/28]
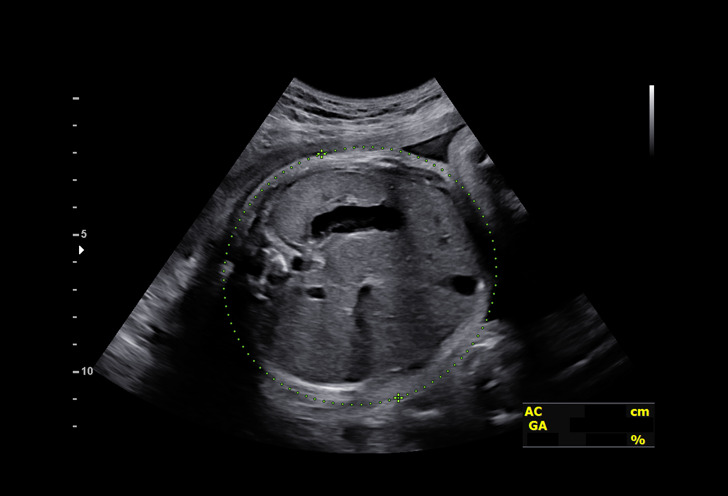
[im 14/28]
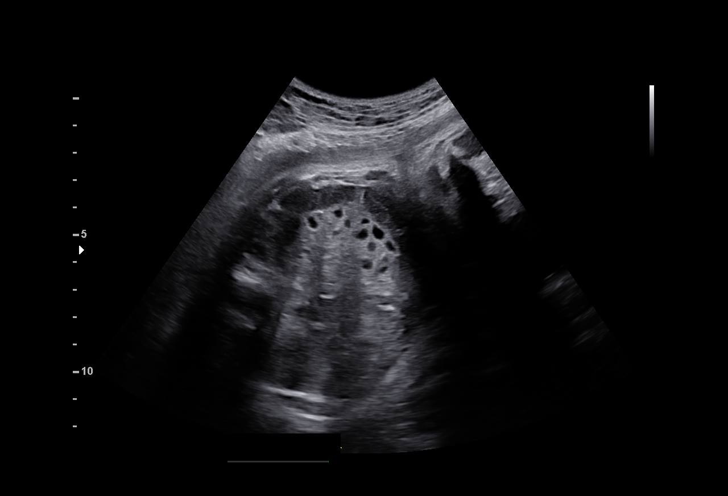
[im 16/28]
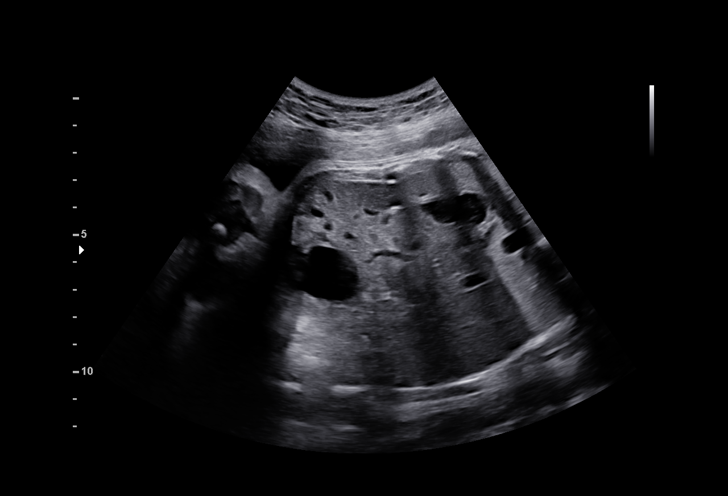
[im 18/28]
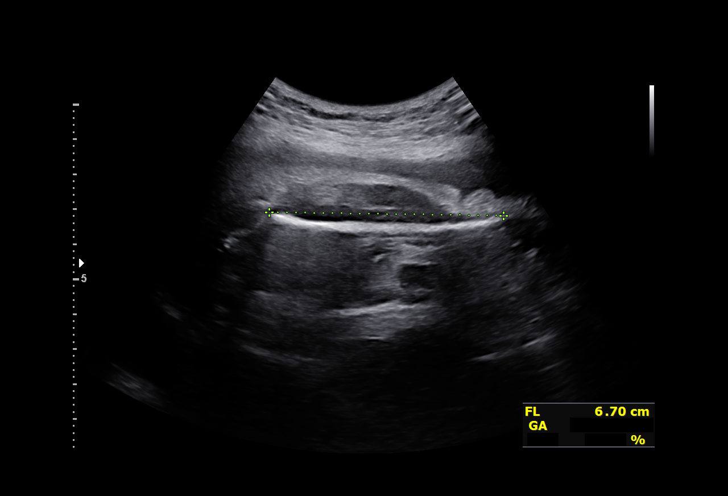
[im 20/28]
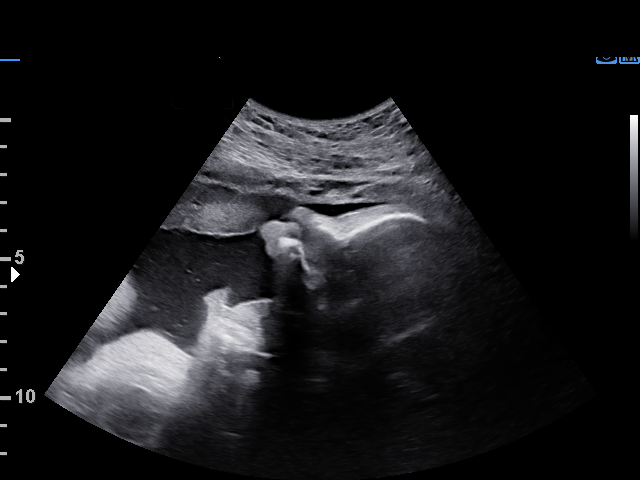
[im 22/28]
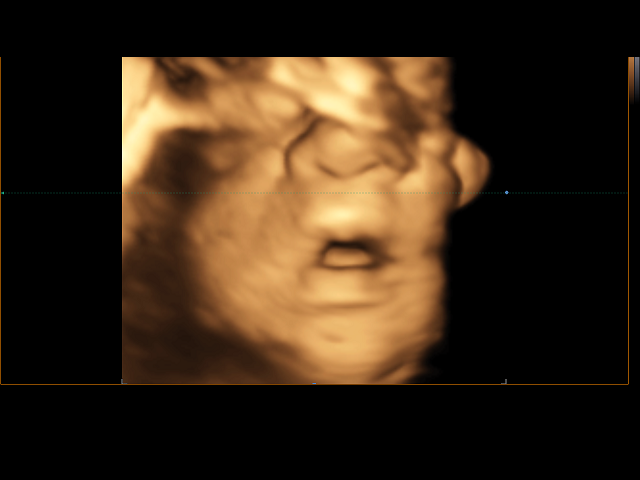
[im 24/28]
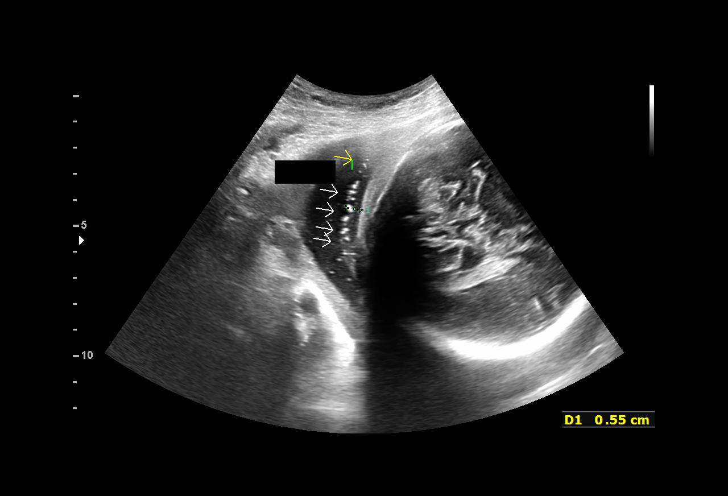
[im 26/28]
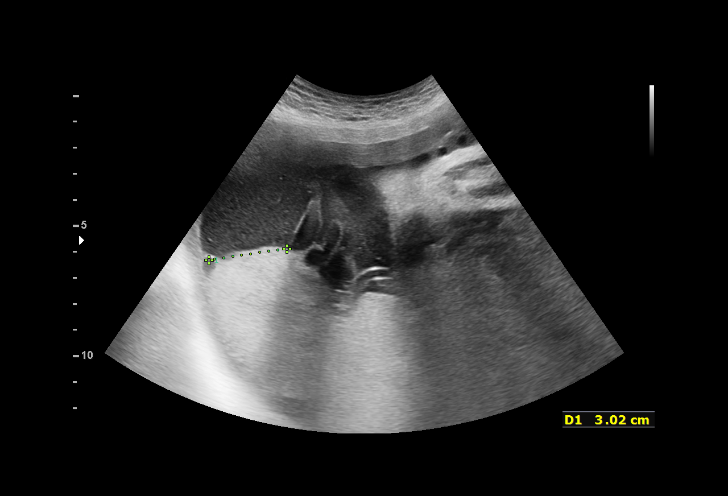
[im 28/28]
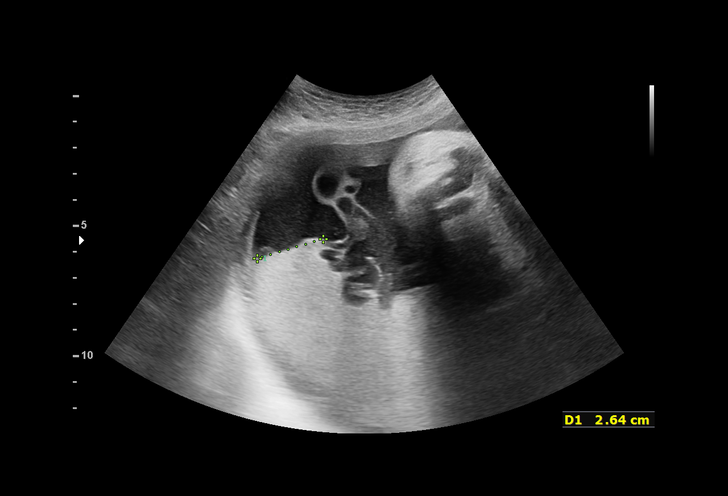

[14 of 28 positions shown; findings below may reference images not displayed]

Indications

 33 weeks gestation of pregnancy
 Advanced maternal age multigravida 35+,
 third trimester
 Marginal insertion of umbilical cord affecting
 management of mother in third trimester
 Hypothyroid - synthroid
 LR NIPS, Neg AFP
Fetal Evaluation

 Num Of Fetuses:         1
 Fetal Heart Rate(bpm):  147
 Cardiac Activity:       Observed
 Presentation:           Cephalic
 Placenta:               Posterior
 P. Cord Insertion:      Visualized, central

 Amniotic Fluid
 AFI FV:      Within normal limits

 AFI Sum(cm)     %Tile       Largest Pocket(cm)
 20.4            76

 RUQ(cm)       RLQ(cm)       LUQ(cm)        LLQ(cm)

Biometry
 BPD:      90.4  mm     G. Age:  36w 4d         99  %    CI:        75.95   %    70 - 86
                                                         FL/HC:      20.5   %    19.4 -
 HC:      328.8  mm     G. Age:  37w 3d         95  %    HC/AC:      1.08        0.96 -
 AC:      304.8  mm     G. Age:  34w 3d         77  %    FL/BPD:     74.7   %    71 - 87
 FL:       67.5  mm     G. Age:  34w 5d         70  %    FL/AC:      22.1   %    20 - 24

 Est. FW:    7929  gm    5 lb 11 oz      84  %
OB History

 Gravidity:    1
Gestational Age

 LMP:           33w 4d        Date:  07/12/20                 EDD:   04/18/21
 U/S Today:     35w 6d                                        EDD:   04/02/21
 Best:          33w 4d     Det. By:  LMP  (07/12/20)          EDD:   04/18/21
Anatomy

 Cranium:               Appears normal         Aortic Arch:            Previously seen
 Cavum:                 Previously seen        Ductal Arch:            Previously seen
 Ventricles:            Appears normal         Diaphragm:              Previously seen
 Choroid Plexus:        Previously seen        Stomach:                Appears normal, left
                                                                       sided
 Cerebellum:            Previously seen        Abdomen:                Previously seen
 Posterior Fossa:       Previously seen        Abdominal Wall:         Previously seen
 Nuchal Fold:           Previously seen        Cord Vessels:           Previously seen
 Face:                  Orbits and profile     Kidneys:                Appear normal
                        previously seen
 Lips:                  Previously seen        Bladder:                Appears normal
 Thoracic:              Previously seen        Spine:                  Previously seen
 Heart:                 Appears normal         Upper Extremities:      Previously seen
                        (4CH, axis, and
                        situs)
 RVOT:                  Previously seen        Lower Extremities:      Previously seen
 LVOT:                  Previously seen

 Other:  Female gender previously seen. VC, 3VV and 3VTV previously
         visualized. Heels and 5th digit previously visualized.
Cervix Uterus Adnexa

 Cervix
 Not visualized (advanced GA >49wks)

 Right Ovary
 Visualized.

 Left Ovary
 Visualized.
Impression

 Marginal cord seen on previous ultrasound.
 Amniotic fluid is normal and good fetal activity is seen .Fetal
 growth is appropriate for gestational age head circumference
 measurement is above the 90th percentile.  Intracranial
 structures appear normal.  Placental cord insertion appears
 normal.

 Maternal hypothyroidism.  Most recent TSH level is within
 normal range (euthyroid).
Recommendations

 -An appointment was made for her to return in 4 weeks for
 fetal growth assessment. -An appointment was made for her
 to return in 4 weeks for fetal growth assessment.
                 Camema, Silvjo

## 2023-07-11 DIAGNOSIS — Z0000 Encounter for general adult medical examination without abnormal findings: Secondary | ICD-10-CM | POA: Diagnosis not present

## 2023-07-11 DIAGNOSIS — Z1331 Encounter for screening for depression: Secondary | ICD-10-CM | POA: Diagnosis not present

## 2023-07-11 DIAGNOSIS — E039 Hypothyroidism, unspecified: Secondary | ICD-10-CM | POA: Diagnosis not present

## 2023-07-11 DIAGNOSIS — G2571 Drug induced akathisia: Secondary | ICD-10-CM | POA: Diagnosis not present

## 2023-07-11 DIAGNOSIS — E559 Vitamin D deficiency, unspecified: Secondary | ICD-10-CM | POA: Diagnosis not present

## 2023-07-11 DIAGNOSIS — Z1231 Encounter for screening mammogram for malignant neoplasm of breast: Secondary | ICD-10-CM | POA: Diagnosis not present

## 2023-07-11 DIAGNOSIS — R7301 Impaired fasting glucose: Secondary | ICD-10-CM | POA: Diagnosis not present

## 2023-07-11 DIAGNOSIS — Z6826 Body mass index (BMI) 26.0-26.9, adult: Secondary | ICD-10-CM | POA: Diagnosis not present

## 2023-08-01 ENCOUNTER — Other Ambulatory Visit: Payer: Self-pay

## 2023-08-01 ENCOUNTER — Other Ambulatory Visit (HOSPITAL_COMMUNITY): Payer: Self-pay

## 2023-08-01 MED ORDER — ESCITALOPRAM OXALATE 20 MG PO TABS
20.0000 mg | ORAL_TABLET | Freq: Every day | ORAL | 3 refills | Status: DC
  Filled 2023-08-01: qty 90, 90d supply, fill #0
  Filled 2023-10-29: qty 90, 90d supply, fill #1
  Filled 2024-01-29: qty 90, 90d supply, fill #2
  Filled 2024-04-28: qty 90, 90d supply, fill #3

## 2023-08-02 ENCOUNTER — Other Ambulatory Visit: Payer: Self-pay

## 2023-08-02 ENCOUNTER — Encounter: Payer: Self-pay | Admitting: Pharmacist

## 2023-08-06 ENCOUNTER — Other Ambulatory Visit (HOSPITAL_COMMUNITY): Payer: Self-pay

## 2023-09-20 ENCOUNTER — Other Ambulatory Visit (HOSPITAL_COMMUNITY): Payer: Self-pay

## 2023-09-20 MED ORDER — LEVOTHYROXINE SODIUM 75 MCG PO TABS
75.0000 ug | ORAL_TABLET | Freq: Every morning | ORAL | 3 refills | Status: DC
  Filled 2023-09-20: qty 90, 90d supply, fill #0
  Filled 2023-12-15: qty 90, 90d supply, fill #1
  Filled 2024-03-23: qty 90, 90d supply, fill #2
  Filled 2024-06-24: qty 90, 90d supply, fill #3

## 2023-10-30 ENCOUNTER — Other Ambulatory Visit (HOSPITAL_COMMUNITY): Payer: Self-pay

## 2023-12-15 ENCOUNTER — Other Ambulatory Visit (HOSPITAL_COMMUNITY): Payer: Self-pay

## 2024-01-14 DIAGNOSIS — Z6826 Body mass index (BMI) 26.0-26.9, adult: Secondary | ICD-10-CM | POA: Diagnosis not present

## 2024-01-14 DIAGNOSIS — E559 Vitamin D deficiency, unspecified: Secondary | ICD-10-CM | POA: Diagnosis not present

## 2024-01-14 DIAGNOSIS — R7301 Impaired fasting glucose: Secondary | ICD-10-CM | POA: Diagnosis not present

## 2024-01-14 DIAGNOSIS — E039 Hypothyroidism, unspecified: Secondary | ICD-10-CM | POA: Diagnosis not present

## 2024-01-14 DIAGNOSIS — K219 Gastro-esophageal reflux disease without esophagitis: Secondary | ICD-10-CM | POA: Diagnosis not present

## 2024-01-14 DIAGNOSIS — R635 Abnormal weight gain: Secondary | ICD-10-CM | POA: Diagnosis not present

## 2024-01-29 ENCOUNTER — Other Ambulatory Visit: Payer: Self-pay | Admitting: Internal Medicine

## 2024-01-29 DIAGNOSIS — Z1231 Encounter for screening mammogram for malignant neoplasm of breast: Secondary | ICD-10-CM

## 2024-01-30 ENCOUNTER — Other Ambulatory Visit (HOSPITAL_COMMUNITY): Payer: Self-pay

## 2024-02-04 ENCOUNTER — Other Ambulatory Visit: Payer: Self-pay | Admitting: Medical Genetics

## 2024-02-06 ENCOUNTER — Ambulatory Visit
Admission: RE | Admit: 2024-02-06 | Discharge: 2024-02-06 | Disposition: A | Discharge status: Home / Self Care | Source: Ambulatory Visit | Attending: Internal Medicine | Admitting: Internal Medicine

## 2024-02-06 DIAGNOSIS — Z1231 Encounter for screening mammogram for malignant neoplasm of breast: Secondary | ICD-10-CM

## 2024-02-11 ENCOUNTER — Other Ambulatory Visit: Payer: Self-pay | Admitting: Internal Medicine

## 2024-02-11 DIAGNOSIS — R928 Other abnormal and inconclusive findings on diagnostic imaging of breast: Secondary | ICD-10-CM

## 2024-02-26 ENCOUNTER — Ambulatory Visit
Admission: RE | Admit: 2024-02-26 | Discharge: 2024-02-26 | Disposition: A | Discharge status: Home / Self Care | Source: Ambulatory Visit | Attending: Internal Medicine | Admitting: Internal Medicine

## 2024-02-26 DIAGNOSIS — R921 Mammographic calcification found on diagnostic imaging of breast: Secondary | ICD-10-CM | POA: Diagnosis not present

## 2024-02-26 DIAGNOSIS — R928 Other abnormal and inconclusive findings on diagnostic imaging of breast: Secondary | ICD-10-CM

## 2024-03-11 ENCOUNTER — Other Ambulatory Visit: Payer: Self-pay | Admitting: Medical Genetics

## 2024-03-11 DIAGNOSIS — Z006 Encounter for examination for normal comparison and control in clinical research program: Secondary | ICD-10-CM

## 2024-03-24 ENCOUNTER — Other Ambulatory Visit: Payer: Self-pay

## 2024-03-24 LAB — GENECONNECT MOLECULAR SCREEN: Genetic Analysis Overall Interpretation: NEGATIVE

## 2024-04-28 ENCOUNTER — Other Ambulatory Visit (HOSPITAL_COMMUNITY): Payer: Self-pay

## 2024-05-07 DIAGNOSIS — H5213 Myopia, bilateral: Secondary | ICD-10-CM | POA: Diagnosis not present

## 2024-06-24 ENCOUNTER — Other Ambulatory Visit (HOSPITAL_COMMUNITY): Payer: Self-pay

## 2024-07-21 ENCOUNTER — Other Ambulatory Visit (HOSPITAL_COMMUNITY): Payer: Self-pay

## 2024-07-21 MED ORDER — ESCITALOPRAM OXALATE 20 MG PO TABS
20.0000 mg | ORAL_TABLET | Freq: Every day | ORAL | 3 refills | Status: AC
  Filled 2024-07-21: qty 90, 90d supply, fill #0

## 2024-07-22 ENCOUNTER — Other Ambulatory Visit: Payer: Self-pay

## 2024-09-19 ENCOUNTER — Other Ambulatory Visit (HOSPITAL_COMMUNITY): Payer: Self-pay

## 2024-09-19 MED ORDER — ESTRADIOL 0.01 % VA CREA
1.0000 g | TOPICAL_CREAM | VAGINAL | 3 refills | Status: AC
  Filled 2024-09-19: qty 127.5, 90d supply, fill #0

## 2024-09-19 MED ORDER — PANTOPRAZOLE SODIUM 40 MG PO TBEC
40.0000 mg | DELAYED_RELEASE_TABLET | Freq: Every day | ORAL | 3 refills | Status: AC
  Filled 2024-09-19: qty 90, 90d supply, fill #0

## 2024-09-20 ENCOUNTER — Other Ambulatory Visit (HOSPITAL_COMMUNITY): Payer: Self-pay

## 2024-09-21 ENCOUNTER — Other Ambulatory Visit (HOSPITAL_COMMUNITY): Payer: Self-pay

## 2024-09-21 MED ORDER — NITROFURANTOIN MONOHYD MACRO 100 MG PO CAPS
100.0000 mg | ORAL_CAPSULE | Freq: Two times a day (BID) | ORAL | 0 refills | Status: AC
  Filled 2024-09-21: qty 10, 5d supply, fill #0

## 2024-09-22 ENCOUNTER — Other Ambulatory Visit (HOSPITAL_COMMUNITY): Payer: Self-pay

## 2024-09-22 ENCOUNTER — Other Ambulatory Visit: Payer: Self-pay

## 2024-09-22 MED ORDER — LEVOTHYROXINE SODIUM 75 MCG PO TABS
75.0000 ug | ORAL_TABLET | ORAL | 3 refills | Status: AC
  Filled 2024-09-22: qty 90, 90d supply, fill #0

## 2024-09-23 ENCOUNTER — Other Ambulatory Visit: Payer: Self-pay

## 2024-09-25 ENCOUNTER — Other Ambulatory Visit: Payer: Self-pay

## 2024-09-30 ENCOUNTER — Encounter (HOSPITAL_COMMUNITY): Payer: Self-pay

## 2024-10-02 ENCOUNTER — Other Ambulatory Visit (HOSPITAL_COMMUNITY): Payer: Self-pay

## 2024-10-02 ENCOUNTER — Encounter (HOSPITAL_COMMUNITY): Payer: Self-pay

## 2024-10-03 ENCOUNTER — Other Ambulatory Visit: Payer: Self-pay

## 2024-10-03 ENCOUNTER — Other Ambulatory Visit (HOSPITAL_COMMUNITY): Payer: Self-pay
